# Patient Record
Sex: Male | Born: 1965 | Race: White | Hispanic: No | Marital: Married | Smoking: Never smoker
Health system: Southern US, Community
[De-identification: ages and names within clinical notes are randomized; demographics above are authoritative.]

## PROBLEM LIST (undated history)

## (undated) DIAGNOSIS — I493 Ventricular premature depolarization: Secondary | ICD-10-CM

## (undated) DIAGNOSIS — M24112 Other articular cartilage disorders, left shoulder: Secondary | ICD-10-CM

## (undated) DIAGNOSIS — R0683 Snoring: Secondary | ICD-10-CM

## (undated) HISTORY — PX: REPLACEMENT TOTAL KNEE: SUR1224

## (undated) HISTORY — PX: NOSE SURGERY: SHX723

---

## 2004-09-12 ENCOUNTER — Ambulatory Visit: Payer: Self-pay | Admitting: Internal Medicine

## 2006-01-03 ENCOUNTER — Ambulatory Visit: Payer: Self-pay | Admitting: Emergency Medicine

## 2006-01-03 ENCOUNTER — Inpatient Hospital Stay (HOSPITAL_COMMUNITY): Admission: AC | Admit: 2006-01-03 | Discharge: 2006-01-14 | Payer: Self-pay

## 2006-01-13 ENCOUNTER — Ambulatory Visit: Payer: Self-pay | Admitting: Physical Medicine & Rehabilitation

## 2006-01-14 ENCOUNTER — Ambulatory Visit: Payer: Self-pay | Admitting: Physical Medicine & Rehabilitation

## 2006-01-14 ENCOUNTER — Inpatient Hospital Stay (HOSPITAL_COMMUNITY)
Admission: RE | Admit: 2006-01-14 | Discharge: 2006-01-19 | Payer: Self-pay | Admitting: Physical Medicine & Rehabilitation

## 2006-01-26 ENCOUNTER — Encounter
Admission: RE | Admit: 2006-01-26 | Discharge: 2006-02-17 | Payer: Self-pay | Admitting: Physical Medicine & Rehabilitation

## 2007-08-30 IMAGING — CR DG CHEST 2V
2 series · 2 of 2 positions shown · non-contrast
Comparison: None.

CLINICAL DATA: 40-year-old, fell.  Right shoulder pain and chest pain. 
 RIGHT SHOULDER - 2 VIEW:

[w chest pa]
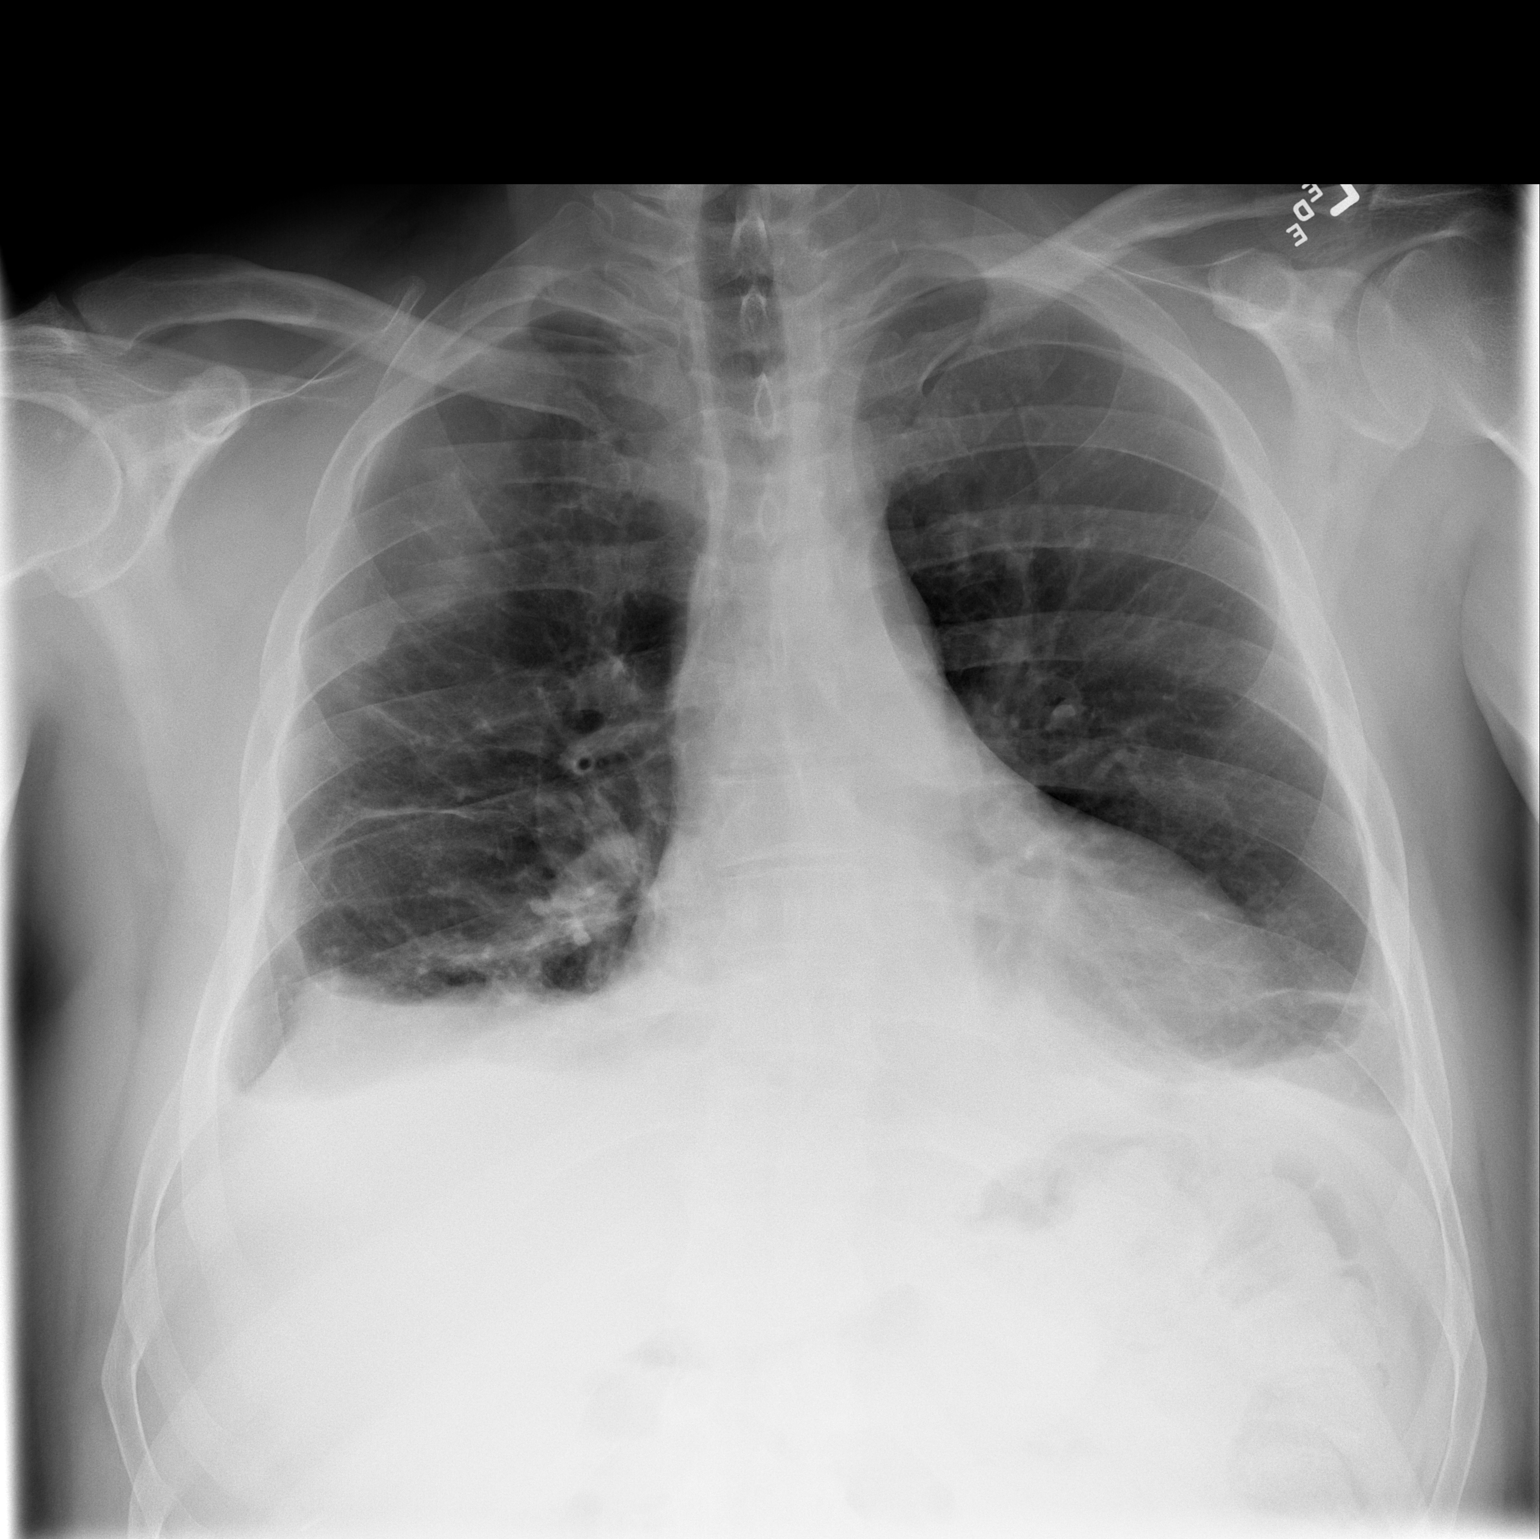

[w chest lat]
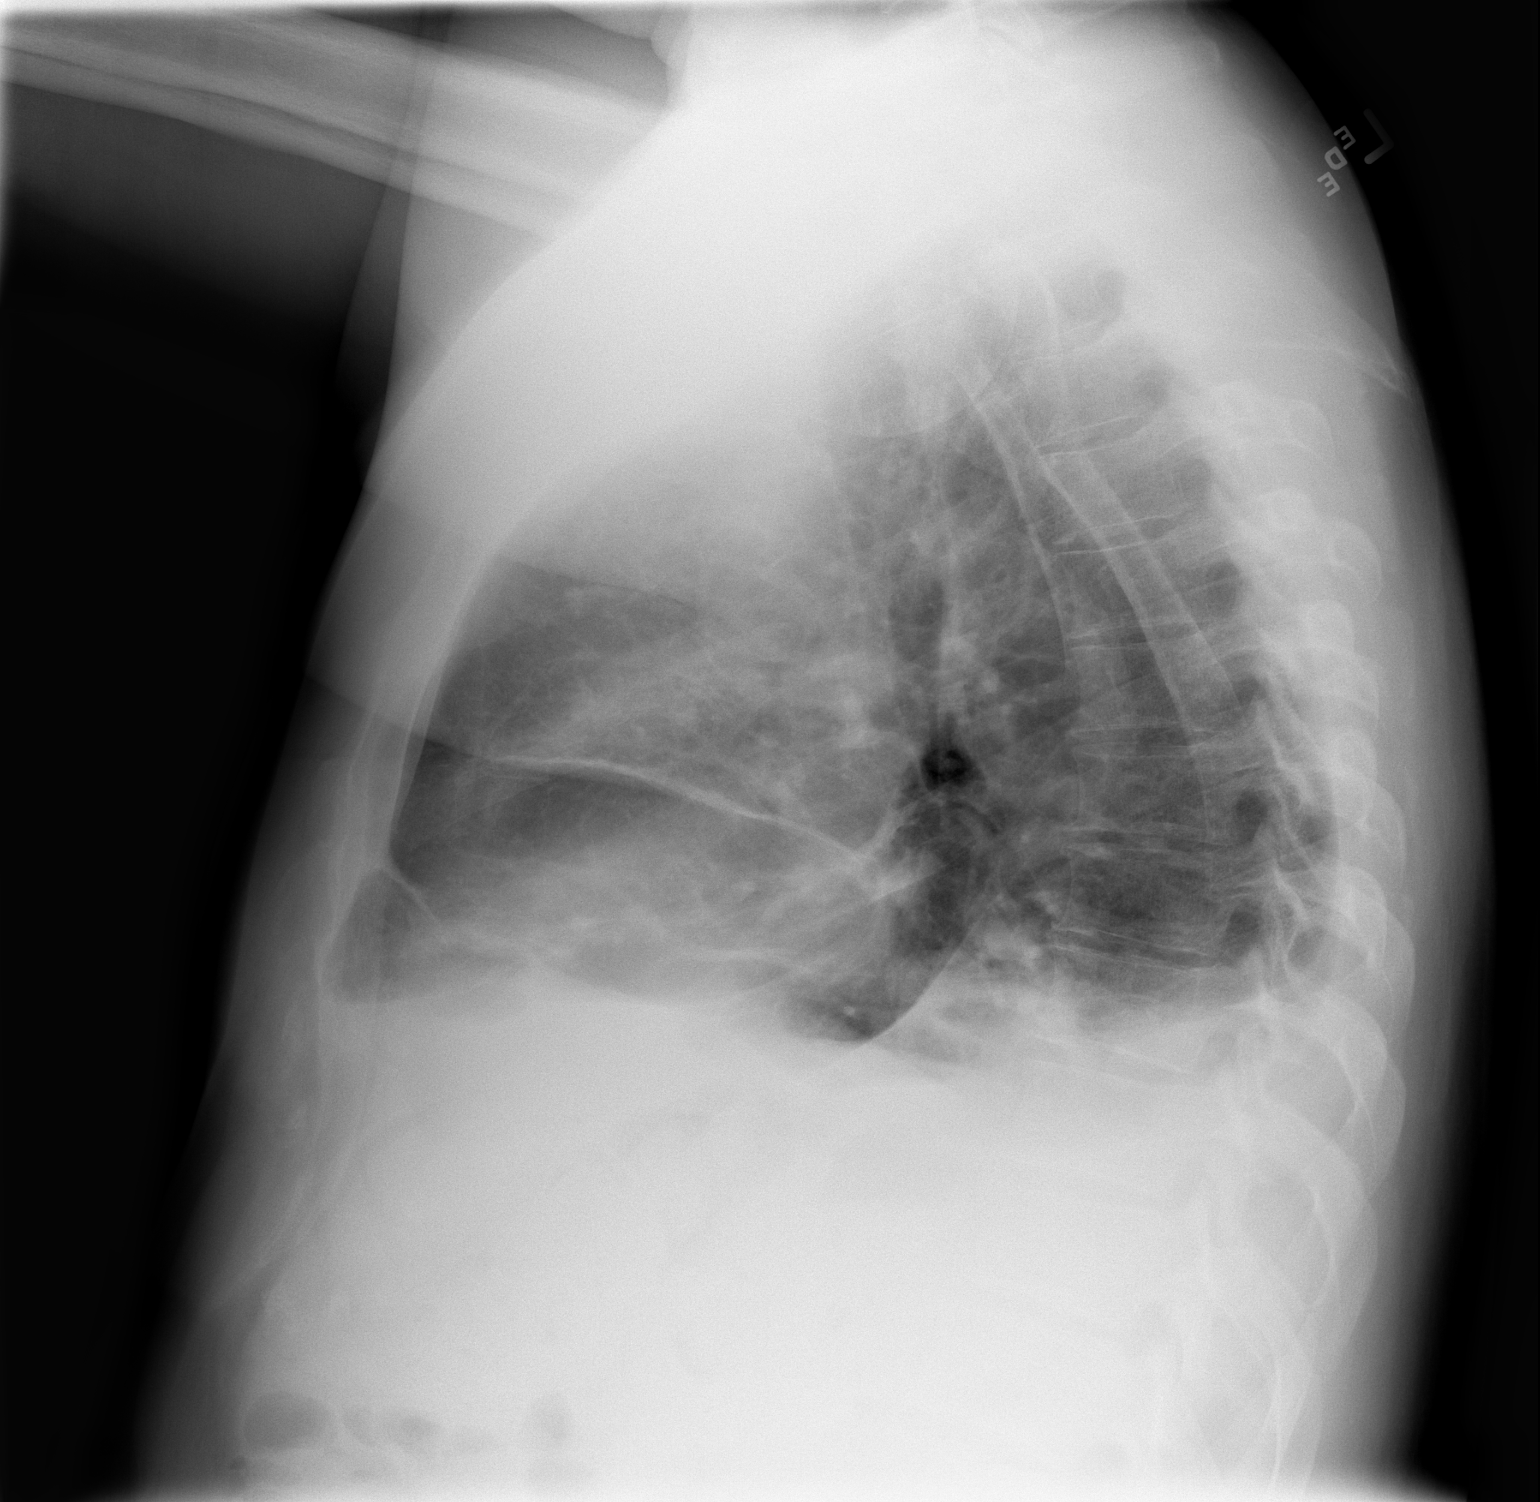

[2 of 2 positions shown; findings below may reference images not displayed]

FINDINGS: Mild AC joint degenerative changes.  There is mild lateral downsloping of the acromion.  No acute bony findings.  No abnormal soft tissue calcifications.  Patient does have right sided rib fractures.  The 2nd and 3rd anterior ribs are fractured and the 3rd, 4th and 5th posterior ribs are fractured.  No pneumothorax is seen.
IMPRESSION: No acute bony findings involving the shoulder . There are 2nd and 3rd anterior rib fractures and 3rd, 4th, and 5th posterior fractures.  
 CHEST - 2 VIEW:
FINDINGS: As seen on the shoulder films, there are right sided rib fractures.  The 2nd and 3rd anterior ribs are fractured and the 2nd, 3rd, and 4th posterior ribs are fractured.  No definite pneumothorax.  There is bibasilar atelectasis right greater than left and small bilateral pleural effusions right greater than left.  Heart is borderline enlarged.
IMPRESSION: 1.  Right sided rib fractures as discussed above.  No definite pneumothorax is seen. 
 2.  Bibasilar atelectasis and bilateral pleural effusions, right greater than left.

## 2008-09-27 ENCOUNTER — Telehealth: Payer: Self-pay | Admitting: Internal Medicine

## 2008-09-28 ENCOUNTER — Ambulatory Visit: Payer: Self-pay | Admitting: Family Medicine

## 2008-09-28 DIAGNOSIS — M7120 Synovial cyst of popliteal space [Baker], unspecified knee: Secondary | ICD-10-CM

## 2009-01-01 ENCOUNTER — Ambulatory Visit: Payer: Self-pay | Admitting: Family Medicine

## 2009-03-09 ENCOUNTER — Ambulatory Visit: Payer: Self-pay | Admitting: Internal Medicine

## 2009-03-09 DIAGNOSIS — M1710 Unilateral primary osteoarthritis, unspecified knee: Secondary | ICD-10-CM

## 2009-03-13 ENCOUNTER — Inpatient Hospital Stay (HOSPITAL_COMMUNITY): Admission: RE | Admit: 2009-03-13 | Discharge: 2009-03-14 | Payer: Self-pay | Admitting: Orthopedic Surgery

## 2010-03-10 LAB — CONVERTED CEMR LAB
BUN: 12 mg/dL (ref 6–23)
Calcium: 9 mg/dL (ref 8.4–10.5)
Creatinine, Ser: 0.7 mg/dL (ref 0.4–1.5)
Eosinophils Relative: 0.7 % (ref 0.0–5.0)
GFR calc non Af Amer: 129.48 mL/min (ref >60)
Lymphocytes Relative: 10.1 % — ABNORMAL LOW (ref 12.0–46.0)
Monocytes Absolute: 0.5 10*3/uL (ref 0.1–1.0)
Monocytes Relative: 4.2 % (ref 3.0–12.0)
Neutrophils Relative %: 85 % — ABNORMAL HIGH (ref 43.0–77.0)
Platelets: 171 10*3/uL (ref 150.0–400.0)
WBC: 11.4 10*3/uL — ABNORMAL HIGH (ref 4.5–10.5)

## 2010-03-12 NOTE — Assessment & Plan Note (Signed)
Summary: MED CLEAR FOR KNEE INJURY // RS   Vital Signs:  Patient profile:   45 year old male Height:      71.5 inches Weight:      241 pounds BMI:     33.26 Pulse rate:   76 / minute Resp:     12 per minute BP sitting:   140 / 92  (left arm)  Vitals Entered By: Gladis Riffle, RN (March 09, 2009 8:56 AM)  Nutrition Counseling: Patient's BMI is greater than 25 and therefore counseled on weight management options.  Is Patient Diabetic? No Nutritional Status clearance for knee surgery   History of Present Illness: asked to see by dr. Charlann Boxer for surgical clearance ---revision L total knee replacement  reviewed dr. 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 Gladis Riffle, RN (March 09, 2009 8:58 AM)  Past History:  Past Surgical History: Last updated: 09/28/2008 LASIK signs knee replacement 1995  Family History: Last updated: 09/28/2008 Family History High cholesterol Family History Hypertension Family History Kidney disease Family History Lung cancer Family History Diabetes   Social History: Last updated: 09/28/2008 Married Never Smoked Alcohol use-yes Drug use-no Regular exercise-no Occupation:  Surveyor, minerals  Risk Factors: Exercise: no (10/06/2006)  Risk Factors: Smoking Status:  never (03/09/2009)   Impression & Recommendations:  Problem # 1:  OSTEOARTHRITIS, KNEE (ICD-715.96) scheduled for surgery  Problem # 2:  PREOPERATIVE EXAMINATION (ICD-V72.84)  EKG pt is scheduled for surgery--- his risk for surgery is low (average for age).   cc : dr Charlann Boxer  Orders: EKG w/ Interpretation ([id number])   EKG  Procedure date:  03/09/2009  Findings:      normal:  bradycardia with HR of 59

## 2010-04-29 LAB — DIFFERENTIAL
Lymphocytes Relative: 18 % (ref 12–46)
Lymphs Abs: 1 10*3/uL (ref 0.7–4.0)
Monocytes Relative: 5 % (ref 3–12)
Neutro Abs: 4 10*3/uL (ref 1.7–7.7)
Neutrophils Relative %: 72 % (ref 43–77)

## 2010-04-29 LAB — BASIC METABOLIC PANEL
Calcium: 9.1 mg/dL (ref 8.4–10.5)
Chloride: 104 mEq/L (ref 96–112)
Creatinine, Ser: 0.81 mg/dL (ref 0.4–1.5)
GFR calc Af Amer: >60 mL/min (ref >60)

## 2010-04-29 LAB — CBC
MCV: 89 fL (ref 78.0–100.0)
RBC: 4.63 MIL/uL (ref 4.22–5.81)
WBC: 5.6 10*3/uL (ref 4.0–10.5)

## 2010-04-29 LAB — URINALYSIS, ROUTINE W REFLEX MICROSCOPIC
Glucose, UA: NEGATIVE mg/dL
Protein, ur: NEGATIVE mg/dL

## 2010-04-29 LAB — APTT: aPTT: 28 seconds (ref 24–37)

## 2010-04-29 LAB — PROTIME-INR: INR: 1 (ref 0.00–1.49)

## 2010-05-02 LAB — CBC
MCHC: 33.8 g/dL (ref 30.0–36.0)
Platelets: 194 10*3/uL (ref 150–400)
RDW: 14.4 % (ref 11.5–15.5)

## 2010-05-02 LAB — BASIC METABOLIC PANEL
BUN: 10 mg/dL (ref 6–23)
Calcium: 8.3 mg/dL — ABNORMAL LOW (ref 8.4–10.5)
GFR calc non Af Amer: >60 mL/min (ref >60)
Glucose, Bld: 103 mg/dL — ABNORMAL HIGH (ref 70–99)

## 2010-05-02 LAB — TYPE AND SCREEN
ABO/RH(D): A POS
Antibody Screen: NEGATIVE
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
## 2014-08-17 ENCOUNTER — Emergency Department (HOSPITAL_BASED_OUTPATIENT_CLINIC_OR_DEPARTMENT_OTHER)
Admission: EM | Admit: 2014-08-17 | Discharge: 2014-08-17 | Disposition: A | Discharge status: Home / Self Care | Payer: BLUE CROSS/BLUE SHIELD | Attending: Emergency Medicine | Admitting: Emergency Medicine

## 2014-08-17 ENCOUNTER — Emergency Department (HOSPITAL_BASED_OUTPATIENT_CLINIC_OR_DEPARTMENT_OTHER): Payer: BLUE CROSS/BLUE SHIELD

## 2014-08-17 ENCOUNTER — Encounter (HOSPITAL_BASED_OUTPATIENT_CLINIC_OR_DEPARTMENT_OTHER): Payer: Self-pay

## 2014-08-17 DIAGNOSIS — S299XXA Unspecified injury of thorax, initial encounter: Secondary | ICD-10-CM | POA: Diagnosis present

## 2014-08-17 DIAGNOSIS — S2242XA Multiple fractures of ribs, left side, initial encounter for closed fracture: Secondary | ICD-10-CM | POA: Diagnosis not present

## 2014-08-17 DIAGNOSIS — S301XXA Contusion of abdominal wall, initial encounter: Secondary | ICD-10-CM

## 2014-08-17 DIAGNOSIS — Y998 Other external cause status: Secondary | ICD-10-CM | POA: Diagnosis not present

## 2014-08-17 DIAGNOSIS — Y9289 Other specified places as the place of occurrence of the external cause: Secondary | ICD-10-CM | POA: Insufficient documentation

## 2014-08-17 DIAGNOSIS — L237 Allergic contact dermatitis due to plants, except food: Secondary | ICD-10-CM | POA: Insufficient documentation

## 2014-08-17 DIAGNOSIS — Y9339 Activity, other involving climbing, rappelling and jumping off: Secondary | ICD-10-CM | POA: Diagnosis not present

## 2014-08-17 DIAGNOSIS — W11XXXA Fall on and from ladder, initial encounter: Secondary | ICD-10-CM | POA: Insufficient documentation

## 2014-08-17 DIAGNOSIS — W19XXXA Unspecified fall, initial encounter: Secondary | ICD-10-CM

## 2014-08-17 DIAGNOSIS — S2232XA Fracture of one rib, left side, initial encounter for closed fracture: Secondary | ICD-10-CM

## 2014-08-17 DIAGNOSIS — S37012A Minor contusion of left kidney, initial encounter: Secondary | ICD-10-CM | POA: Diagnosis not present

## 2014-08-17 LAB — BASIC METABOLIC PANEL
Anion gap: 8 (ref 5–15)
BUN: 12 mg/dL (ref 6–20)
CHLORIDE: 103 mmol/L (ref 101–111)
CO2: 28 mmol/L (ref 22–32)
Calcium: 9 mg/dL (ref 8.9–10.3)
Creatinine, Ser: 0.8 mg/dL (ref 0.61–1.24)
GFR calc Af Amer: >60 mL/min (ref >60)
GFR calc non Af Amer: >60 mL/min (ref >60)
Glucose, Bld: 131 mg/dL — ABNORMAL HIGH (ref 65–99)
Potassium: 4.1 mmol/L (ref 3.5–5.1)
Sodium: 139 mmol/L (ref 135–145)

## 2014-08-17 LAB — CBC
HCT: 43.5 % (ref 39.0–52.0)
Hemoglobin: 14.3 g/dL (ref 13.0–17.0)
MCH: 29.1 pg (ref 26.0–34.0)
MCHC: 32.9 g/dL (ref 30.0–36.0)
MCV: 88.6 fL (ref 78.0–100.0)
PLATELETS: 137 10*3/uL — AB (ref 150–400)
RBC: 4.91 MIL/uL (ref 4.22–5.81)
RDW: 13.1 % (ref 11.5–15.5)
WBC: 11.4 10*3/uL — AB (ref 4.0–10.5)

## 2014-08-17 MED ORDER — KETOROLAC TROMETHAMINE 60 MG/2ML IM SOLN
60.0000 mg | Freq: Once | INTRAMUSCULAR | Status: AC
  Administered 2014-08-17: 60 mg via INTRAMUSCULAR
  Filled 2014-08-17: qty 2

## 2014-08-17 MED ORDER — DEXAMETHASONE SODIUM PHOSPHATE 10 MG/ML IJ SOLN
10.0000 mg | Freq: Once | INTRAMUSCULAR | Status: AC
  Administered 2014-08-17: 10 mg via INTRAMUSCULAR
  Filled 2014-08-17: qty 1

## 2014-08-17 MED ORDER — IOHEXOL 300 MG/ML  SOLN
100.0000 mL | Freq: Once | INTRAMUSCULAR | Status: AC | PRN
  Administered 2014-08-17: 100 mL via INTRAVENOUS

## 2014-08-17 MED ORDER — PREDNISONE 20 MG PO TABS: ORAL_TABLET | ORAL | Status: DC

## 2014-08-17 NOTE — ED Provider Notes (Signed)
Medical screening examination/treatment/procedure(s) were performed by non-physician practitioner and as supervising physician I was immediately available for consultation/collaboration.   EKG Interpretation   Date/Time:  Thursday August 17 2014 13:15:42 EDT Ventricular Rate:  59 PR Interval:  174 QRS Duration: 84 QT Interval:  402 QTC Calculation: 397 R Axis:   55 Text Interpretation:  Sinus bradycardia Otherwise normal ECG No previous  ECGs available Confirmed by Corleen Otwell  MD, Talisa Petrak [phone]) on 08/17/2014  1:18:27 PM      Results for orders placed or performed during the hospital encounter of 03/13/09  Urinalysis, Routine w reflex microscopic  Result Value Ref Range   Color, Urine YELLOW YELLOW   APPearance CLEAR CLEAR   Specific Gravity, Urine 1.025 1.005 - 1.030   pH 5.5 5.0 - 8.0   Glucose, UA NEGATIVE NEGATIVE mg/dL   Hgb urine dipstick NEGATIVE NEGATIVE   Bilirubin Urine NEGATIVE NEGATIVE   Ketones, ur NEGATIVE NEGATIVE mg/dL   Protein, ur NEGATIVE NEGATIVE mg/dL   Urobilinogen, UA 0.2 0.0 - 1.0 mg/dL   Nitrite NEGATIVE NEGATIVE   Leukocytes, UA  NEGATIVE    NEGATIVE MICROSCOPIC NOT DONE ON URINES WITH NEGATIVE PROTEIN, BLOOD, LEUKOCYTES, NITRITE, OR GLUCOSE <1000 mg/dL.  Basic metabolic panel  Result Value Ref Range   Sodium 138 135 - 145 mEq/L   Potassium 4.1 3.5 - 5.1 mEq/L   Chloride 104 96 - 112 mEq/L   CO2 27 19 - 32 mEq/L   Glucose, Bld 91 70 - 99 mg/dL   BUN 12 6 - 23 mg/dL   Creatinine, Ser 0.81 0.4 - 1.5 mg/dL   Calcium 9.1 8.4 - 10.5 mg/dL   GFR calc non Af Amer >60 >60 mL/min   GFR calc Af Amer  >60 mL/min    >60        The eGFR has been calculated using the MDRD equation. This calculation has not been validated in all clinical situations. eGFR's persistently <60 mL/min signify possible Chronic Kidney Disease.  CBC  Result Value Ref Range   WBC 5.6 4.0 - 10.5 K/uL   RBC 4.63 4.22 - 5.81 MIL/uL   Hemoglobin 13.7 13.0 - 17.0 g/dL   HCT 41.2  39.0 - 52.0 %   MCV 89.0 78.0 - 100.0 fL   MCHC 33.2 30.0 - 36.0 g/dL   RDW 14.4 11.5 - 15.5 %   Platelets 253 150 - 400 K/uL  Differential  Result Value Ref Range   Neutrophils Relative % 72 43 - 77 %   Neutro Abs 4.0 1.7 - 7.7 K/uL   Lymphocytes Relative 18 12 - 46 %   Lymphs Abs 1.0 0.7 - 4.0 K/uL   Monocytes Relative 5 3 - 12 %   Monocytes Absolute 0.3 0.1 - 1.0 K/uL   Eosinophils Relative 3 0 - 5 %   Eosinophils Absolute 0.2 0.0 - 0.7 K/uL   Basophils Relative 1 0 - 1 %   Basophils Absolute 0.1 0.0 - 0.1 K/uL  Protime-INR  Result Value Ref Range   Prothrombin Time 13.1 11.6 - 15.2 seconds   INR 1.00 0.00 - 1.49  APTT  Result Value Ref Range   aPTT 28 24 - 37 seconds  Basic metabolic panel  Result Value Ref Range   Sodium 138 135 - 145 mEq/L   Potassium 3.7 3.5 - 5.1 mEq/L   Chloride 104 96 - 112 mEq/L   CO2 28 19 - 32 mEq/L   Glucose, Bld 103 (H) 70 - 99 mg/dL  BUN 10 6 - 23 mg/dL   Creatinine, Ser 0.79 0.4 - 1.5 mg/dL   Calcium 8.3 (L) 8.4 - 10.5 mg/dL   GFR calc non Af Amer >60 >60 mL/min   GFR calc Af Amer  >60 mL/min    >60        The eGFR has been calculated using the MDRD equation. This calculation has not been validated in all clinical situations. eGFR's persistently <60 mL/min signify possible Chronic Kidney Disease.  CBC  Result Value Ref Range   WBC 7.8 4.0 - 10.5 K/uL   RBC 3.78 (L) 4.22 - 5.81 MIL/uL   Hemoglobin (L) 13.0 - 17.0 g/dL    11.4 DELTA CHECK NOTED RESULT REPEATED AND VERIFIED   HCT 33.8 (L) 39.0 - 52.0 %   MCV 89.3 78.0 - 100.0 fL   MCHC 33.8 30.0 - 36.0 g/dL   RDW 14.4 11.5 - 15.5 %   Platelets 194 DELTA CHECK NOTED SPECIMEN CHECKED FOR CLOTS 150 - 400 K/uL  Type and screen  Result Value Ref Range   ABO/RH(D) A POS    Antibody Screen NEG    Sample Expiration 03/16/2009   ABO/Rh  Result Value Ref Range   ABO/RH(D) A POS    Dg Ribs Unilateral W/chest Left  08/17/2014   CLINICAL DATA:  Left anterior rib pain after blunt  trauma this morning. The patient fell on a bucket.  EXAM: LEFT RIBS AND CHEST - 3+ VIEW  COMPARISON:  01/16/2006  FINDINGS: There are nondisplaced fractures of the anterior aspects of the left fifth and sixth ribs. No pneumothorax. No underlying pleural effusion lung contusion. Heart size and vascularity are normal. Lungs are clear. Multiple old healed right posterior rib fractures.  IMPRESSION: Acute fractures of the anterior aspects of the left fifth and sixth ribs.   Electronically Signed   By: Lorriane Shire M.D.   On: 08/17/2014 13:42    Patient seen by me. Patient missed this the last step of a ladder and fell onto a bucket. Bucket went into the left side of his chest he feels like it actually went up under the ribs. Patient has some mild tenderness in the left upper quadrant most of the tenderness and has some redness on the chest wall more around T6 T7 T8 area. X-ray shows evidence of a fracture of the fifth and sixth ribs anteriorly on the left side. Patient is fairly stoic. And once again back to work. The some mild concern for possible splenic injury not hypotensive no significant tenderness but based on the patient's personality feels best to do a CT of the abdomen to rule out any splenic injury. This would be a trauma type CT.  Fredia Sorrow, MD 08/17/14 1356

## 2014-08-17 NOTE — ED Notes (Addendum)
Pt missed last step of ladder-fell onto bucket-denies head/neck injury/pain-left side-no visible injury noted

## 2014-08-17 NOTE — ED Provider Notes (Signed)
CSN: [id number]     Arrival date & time 08/17/14  1247 History   First MD Initiated Contact with Patient 08/17/14 1310     Chief Complaint  Patient presents with  . Fall     (Consider location/radiation/quality/duration/timing/severity/associated sxs/prior Treatment) HPI Comments: Patient presents with complaint of left sided rib pain that started acutely today after he jumped approximately 3 feet off a ladder landing on his feet and then fell onto his left side of landing on a 5 gallon plastic bucket that impacted on his left lower ribs. Patient had immediate severe pain. Pain radiated up into his chest. No shortness of breath. No hemoptysis. No head or neck injury. No significant abdominal pain. Pain is worse with deep breathing. No treatments prior to arrival. No weakness in arms or legs. Patient is ambulatory. The onset of this condition was acute. The course is constant. Alleviating factors: none.    The history is provided by the patient.    History reviewed. No pertinent past medical history. Past Surgical History  Procedure Laterality Date  . Nose surgery    . Replacement total knee     No family history on file. History  Substance Use Topics  . Smoking status: Never Smoker   . Smokeless tobacco: Not on file  . Alcohol Use: No    Review of Systems  Constitutional: Negative for fever.  HENT: Negative for rhinorrhea and sore throat.   Eyes: Negative for redness.  Respiratory: Negative for cough, chest tightness and shortness of breath.   Cardiovascular: Positive for chest pain.  Gastrointestinal: Negative for nausea, vomiting, abdominal pain and diarrhea.  Genitourinary: Negative for dysuria.  Musculoskeletal: Positive for myalgias.  Skin: Negative for rash.  Neurological: Negative for headaches.    Allergies  Review of patient's allergies indicates no known allergies.  Home Medications   Prior to Admission medications   Not on File   BP 126/77 mmHg  Pulse 58   Temp(Src) 97.5 F (36.4 C) (Oral)  Resp 16  Ht 6' (1.829 m)  Wt 225 lb (102.059 kg)  BMI 30.51 kg/m2  SpO2 100%   Physical Exam  Constitutional: He appears well-developed and well-nourished.  HENT:  Head: Normocephalic and atraumatic.  Eyes: Conjunctivae are normal. Right eye exhibits no discharge. Left eye exhibits no discharge.  Neck: Normal range of motion. Neck supple.  Cardiovascular: Normal rate, regular rhythm and normal heart sounds.   No murmur heard. Pulmonary/Chest: Effort normal and breath sounds normal. No respiratory distress. He has no wheezes. He has no rales. He exhibits tenderness.    Abdominal: Soft. There is no tenderness. There is no rebound and no guarding.  Neurological: He is alert.  Skin: Skin is warm and dry.  Psychiatric: He has a normal mood and affect.  Nursing note and vitals reviewed.   ED Course  Procedures (including critical care time) Labs Review Labs Reviewed  CBC - Abnormal; Notable for the following:    WBC 11.4 (*)    Platelets 137 (*)    All other components within normal limits  BASIC METABOLIC PANEL - Abnormal; Notable for the following:    Glucose, Bld 131 (*)    All other components within normal limits    Imaging Review Dg Ribs Unilateral W/chest Left  08/17/2014   CLINICAL DATA:  Left anterior rib pain after blunt trauma this morning. The patient fell on a bucket.  EXAM: LEFT RIBS AND CHEST - 3+ VIEW  COMPARISON:  01/16/2006  FINDINGS: There  are nondisplaced fractures of the anterior aspects of the left fifth and sixth ribs. No pneumothorax. No underlying pleural effusion lung contusion. Heart size and vascularity are normal. Lungs are clear. Multiple old healed right posterior rib fractures.  IMPRESSION: Acute fractures of the anterior aspects of the left fifth and sixth ribs.   Electronically Signed   By: Francene Boyers M.D.   On: 08/17/2014 13:42   Ct Abdomen Pelvis W Contrast  08/17/2014   CLINICAL DATA:  Fall.  Trauma.   EXAM: CT ABDOMEN AND PELVIS WITH CONTRAST  TECHNIQUE: Multidetector CT imaging of the abdomen and pelvis was performed using the standard protocol following bolus administration of intravenous contrast.  CONTRAST:  OMNIPAQUE IOHEXOL 300 MG/ML  SOLN  COMPARISON:  None.  FINDINGS: Lower chest: Lung bases are clear. Chronic right lower rib fracture posteriorly. Heart size normal  Hepatobiliary: Contracted gallbladder.  Normal liver and bile ducts.  Pancreas: Negative  Spleen: Negative  Adrenals/Urinary Tract: Mild stranding around the left lower pole. This is most likely due to mild renal injury and contusion. No renal laceration. Left flank hematoma noted. Nondisplaced fracture left posterior eleventh rib. Right adrenal microcalcifications without mass lesion.  Stomach/Bowel: Negative for bowel obstruction. No bowel wall edema or thickening. Normal appendix.  Vascular/Lymphatic: Negative  Reproductive: Mild prostate enlargement.  Other: Edema in the left flank subcutaneous tissues consistent with hematoma from injury.  Musculoskeletal: Nondisplaced fracture left posterior eleventh rib.  IMPRESSION: Left flank soft tissue hematoma. Mild perinephric edema on the left likely due to mild hemorrhage. No renal laceration. Nondisplaced fracture left posterior eleventh rib.   Electronically Signed   By: Marlan Palau M.D.   On: 08/17/2014 15:04     EKG Interpretation   Date/Time:  Thursday August 17 2014 13:15:42 EDT Ventricular Rate:  59 PR Interval:  174 QRS Duration: 84 QT Interval:  402 QTC Calculation: 397 R Axis:   55 Text Interpretation:  Sinus bradycardia Otherwise normal ECG No previous  ECGs available Confirmed by ZACKOWSKI  MD, SCOTT ([id number]) on 08/17/2014  1:18:27 PM       1:40 PM Patient seen and examined. Work-up initiated. Medications ordered. D/w Dr. Deretha Emory.   Vital signs reviewed and are as follows: BP 126/77 mmHg  Pulse 58  Temp(Src) 97.5 F (36.4 C) (Oral)  Resp 16  Ht 6'  (1.829 m)  Wt 225 lb (102.059 kg)  BMI 30.51 kg/m2  SpO2 100%   1:55 PM Dr. Deretha Emory has seen. Will image abdomen as a precautionary measure.   4:23 PM patient and family updated on CT findings. Patient also asks for medication for poison ivy. He has a diffuse dermatitis consistent with poison ivy dermatitis. Patient given IM Decadron and a course of prednisone for home. Encouraged PCP follow-up in the next 3 days for recheck of his creatinine and urine. Patient encouraged to return with worsening pain or worsening symptoms, shortness of breath, trouble breathing or other concerns. Family and patient verbalized understanding and agrees with plan. He wishes to use Tylenol and ibuprofen at home for pain. He does not want prescribed pain medications.    MDM   Final diagnoses:  Fall  Broken ribs, left, closed, initial encounter  Traumatic hematoma of flank, initial encounter  Renal contusion, left, initial encounter  Poison ivy dermatitis   Patient after fall. Flank hematoma, 3 broken ribs, possible mild renal contusion. Normal spleen. Labs are reassuring. Normal creatinine and hemoglobin. Vital signs are normal. Pain control is the patient's main  problem at this point. Controlled well with IM Toradol in emergency department. Will continue NSAIDs at home. Patient does not want narcotic pain medications.  Poison ivy: Treated with steroids    Renne Crigler, PA-C 08/17/14 1625

## 2014-08-17 NOTE — Discharge Instructions (Signed)
Please read and follow all provided instructions.  Your diagnoses today include:  1. Broken ribs, left, closed, initial encounter   2. Fall   3. Traumatic hematoma of flank, initial encounter   4. Renal contusion, left, initial encounter   5. Poison ivy dermatitis     Tests performed today include:  Blood counts, kidney function-normal  X-ray of chest with normal lungs, broken 5th and 6th anterior ribs  Abdominal CT -- left flank hematoma, possible mild kidney contusion, broken posterior left rib  Vital signs. See below for your results today.   Medications prescribed:   Prednisone - steroid medicine   It is best to take this medication in the morning to prevent sleeping problems. If you are diabetic, monitor your blood sugar closely and stop taking Prednisone if blood sugar is over 300. Take with food to prevent stomach upset.   Take any prescribed medications only as directed.  Home care instructions:  Follow any educational materials contained in this packet.  BE VERY CAREFUL not to take multiple medicines containing Tylenol (also called acetaminophen). Doing so can lead to an overdose which can damage your liver and cause liver failure and possibly death.   Follow-up instructions: Please follow-up with your primary care provider in the next 3 days for further evaluation of your symptoms and recheck of kidney.   Return instructions:   Please return to the Emergency Department if you experience worsening symptoms.   Please return if you have any other emergent concerns.  Additional Information:  Your vital signs today were: BP 150/74 mmHg   Pulse 65   Temp(Src) 97.9 F (36.6 C) (Oral)   Resp 18   Ht 6' (1.829 m)   Wt 225 lb (102.059 kg)   BMI 30.51 kg/m2   SpO2 99% If your blood pressure (BP) was elevated above 135/85 this visit, please have this repeated by your doctor within one month. --------------

## 2014-08-17 NOTE — ED Notes (Signed)
PA Geiple at bedside.  

## 2016-03-30 IMAGING — DX DG RIBS W/ CHEST 3+V*L*
3 series · 3 of 3 positions shown · non-contrast
Comparison: 01/16/2006

CLINICAL DATA: Left anterior rib pain after blunt trauma this
morning. The patient fell on a bucket.

EXAM:
LEFT RIBS AND CHEST - 3+ VIEW

[chest pa]
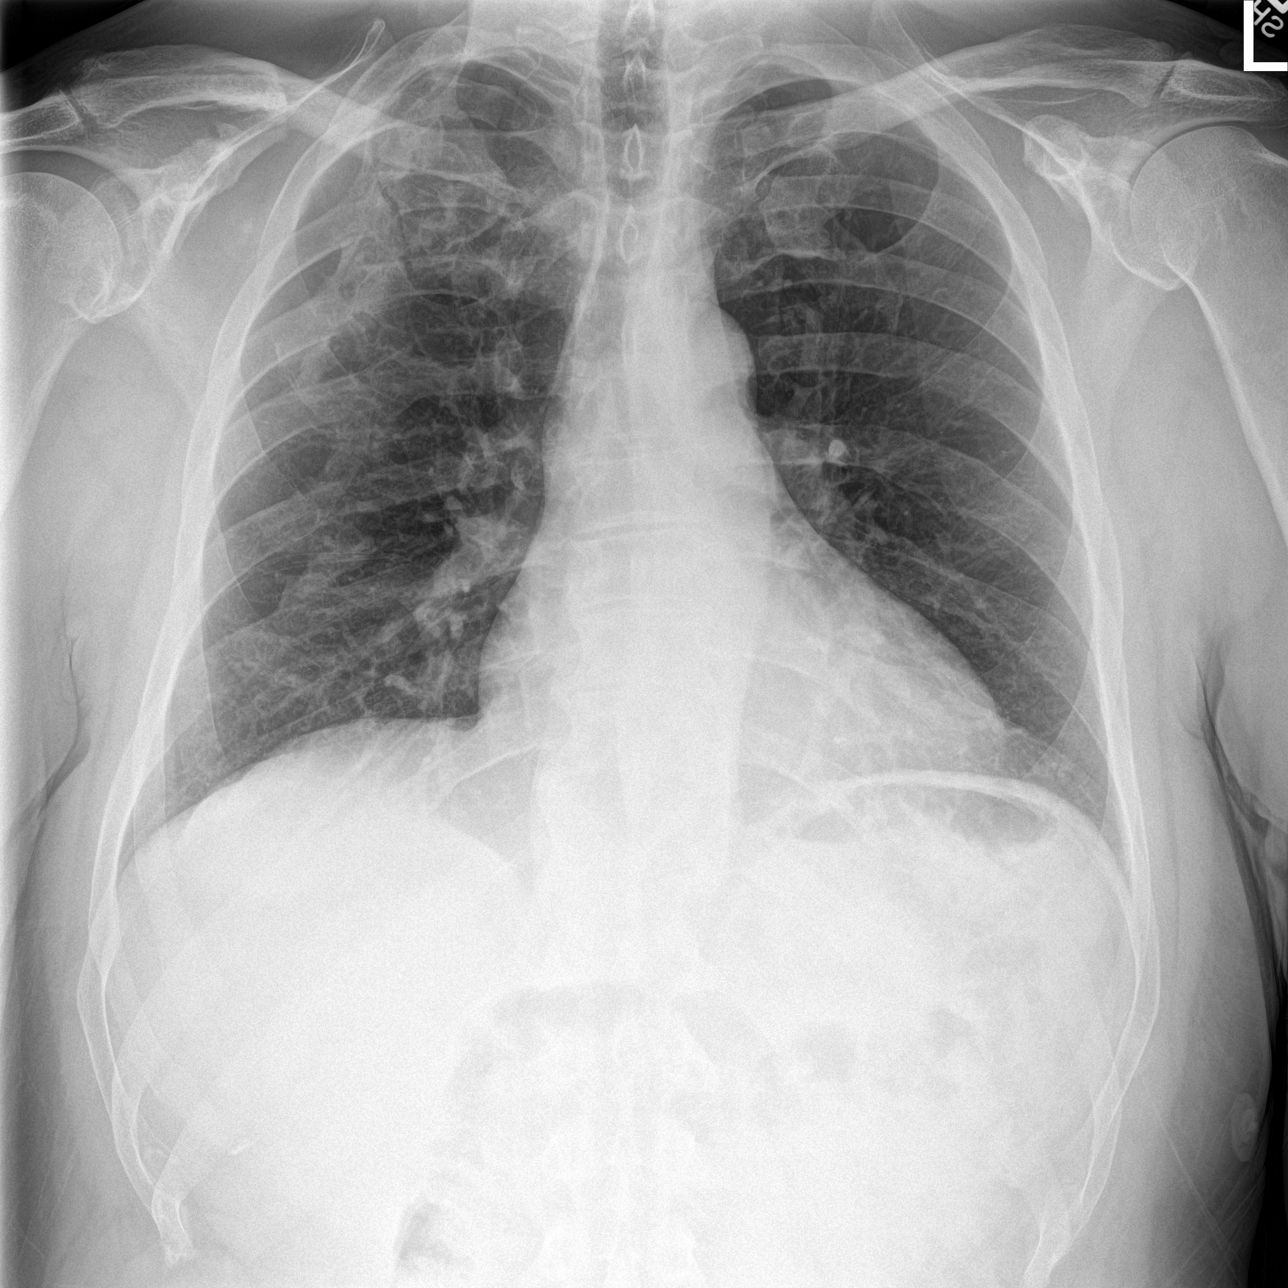

[rib pa obl]
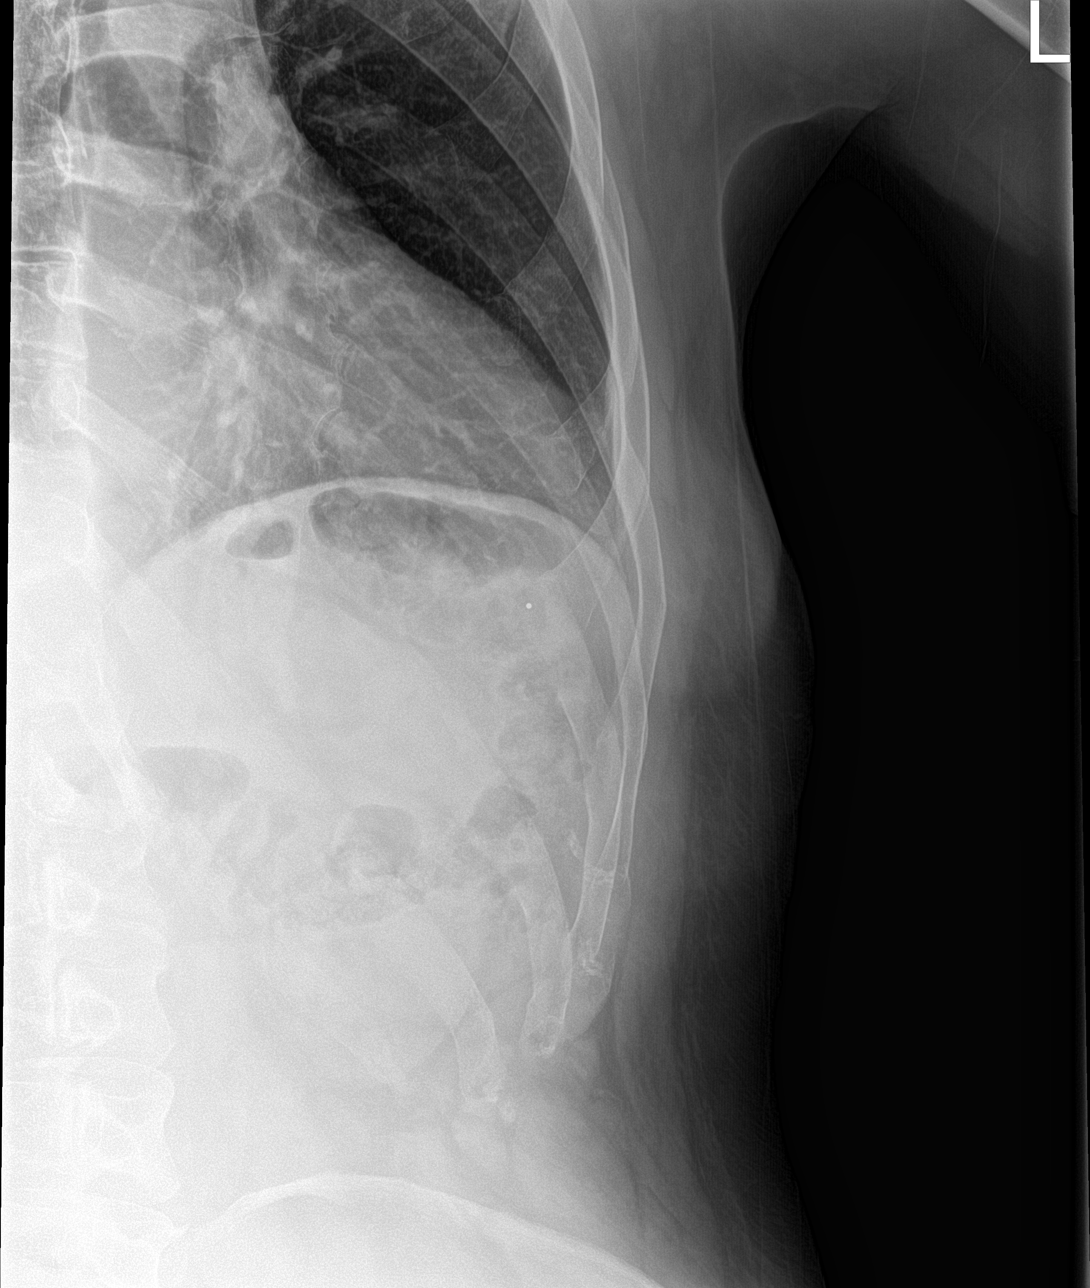

[rib pa]
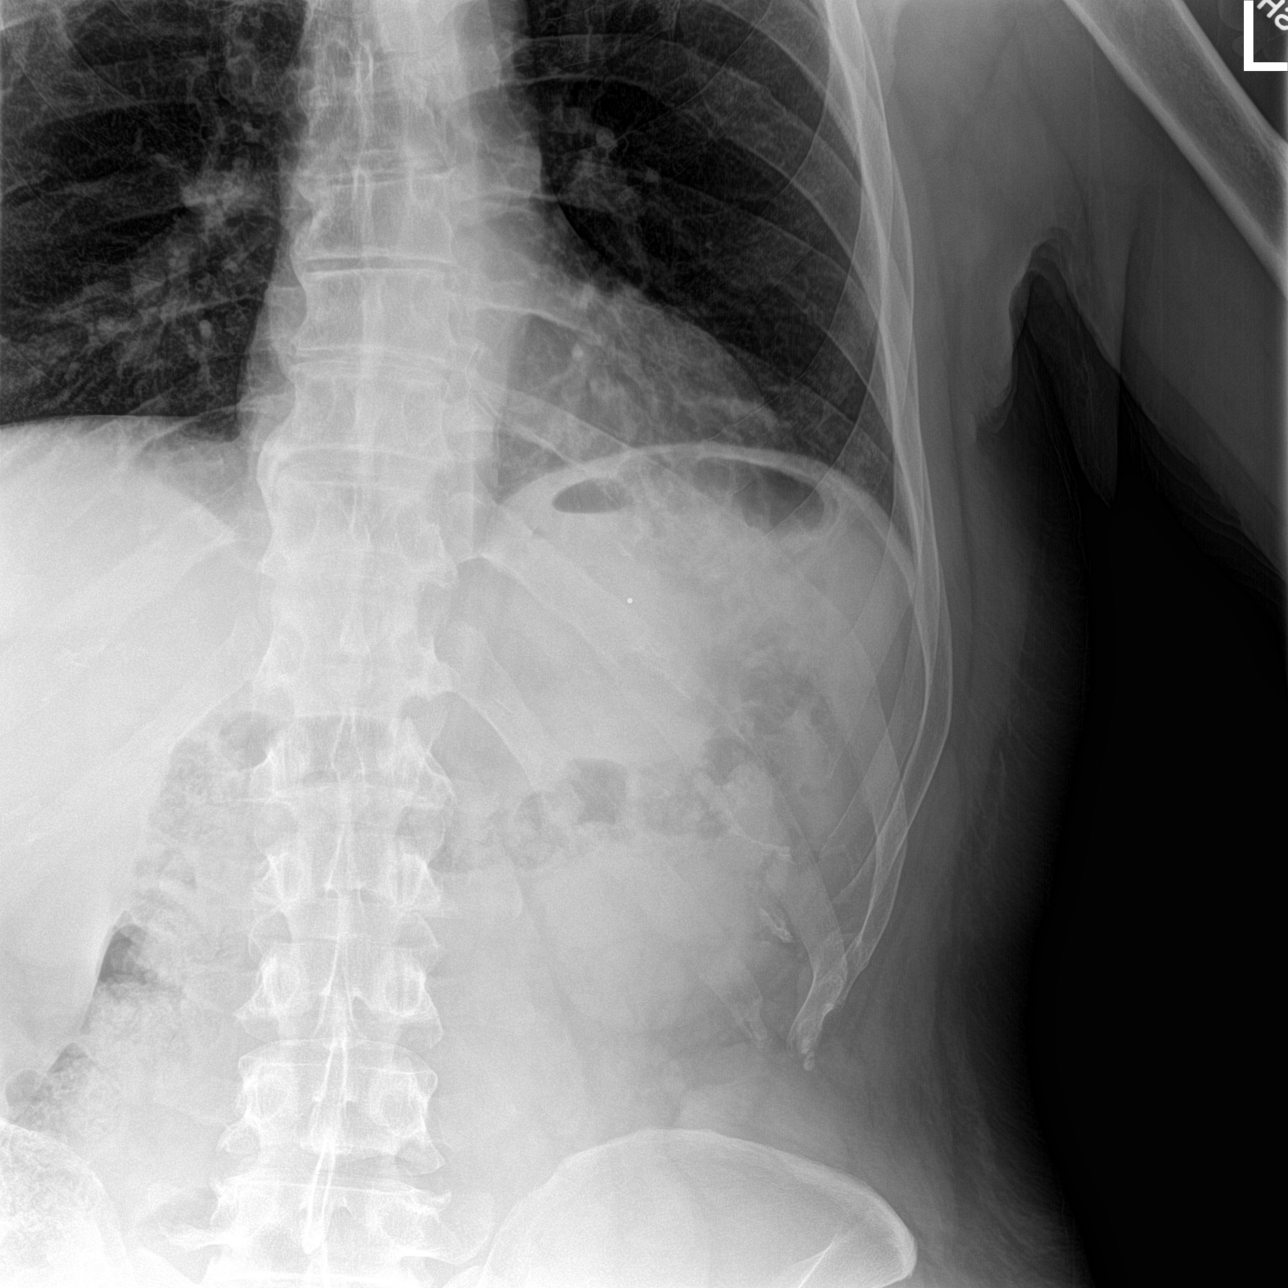

[3 of 3 positions shown; findings below may reference images not displayed]

FINDINGS: There are nondisplaced fractures of the anterior aspects of the left
fifth and sixth ribs. No pneumothorax. No underlying pleural
effusion lung contusion. Heart size and vascularity are normal.
Lungs are clear. Multiple old healed right posterior rib fractures.
IMPRESSION: Acute fractures of the anterior aspects of the left fifth and sixth
ribs.

## 2016-06-16 ENCOUNTER — Ambulatory Visit: Payer: BLUE CROSS/BLUE SHIELD | Admitting: Family Medicine

## 2016-12-08 ENCOUNTER — Ambulatory Visit: Payer: BLUE CROSS/BLUE SHIELD | Admitting: Physician Assistant

## 2016-12-08 DIAGNOSIS — Z0289 Encounter for other administrative examinations: Secondary | ICD-10-CM

## 2016-12-08 NOTE — Progress Notes (Deleted)
    Patient presents to clinic today for annual exam.  Patient is fasting for labs.  Acute Concerns: ***  Chronic Issues: ***  Health Maintenance: Immunizations -- *** Colonoscopy -- ***  No past medical history on file.  Past Surgical History:  Procedure Laterality Date  . NOSE SURGERY    . REPLACEMENT TOTAL KNEE      Current Outpatient Prescriptions on File Prior to Visit  Medication Sig Dispense Refill  . predniSONE (DELTASONE) 20 MG tablet 3 Tabs PO Days 1-3, then 2 tabs PO Days 4-6, then 1 tab PO Day 7-9, then Half Tab PO Day 10-12 20 tablet 0   No current facility-administered medications on file prior to visit.     No Known Allergies  No family history on file.  Social History   Social History  . Marital status: Married    Spouse name: N/A  . Number of children: N/A  . Years of education: N/A   Occupational History  . Not on file.   Social History Main Topics  . Smoking status: Never Smoker  . Smokeless tobacco: Not on file  . Alcohol use No  . Drug use: No  . Sexual activity: Not on file   Other Topics Concern  . Not on file   Social History Narrative  . No narrative on file   ROS  There were no vitals taken for this visit.  Physical Exam   Assessment/Plan: No problem-specific Assessment & Plan notes found for this encounter.    Piedad ClimesMartin, Camyra Vaeth Cody, PA-C

## 2016-12-19 ENCOUNTER — Ambulatory Visit: Payer: BLUE CROSS/BLUE SHIELD | Admitting: Family Medicine

## 2018-07-01 NOTE — Progress Notes (Signed)
Primary Physician:  Gaspar Garbeisovec, Richard W, MD   Patient ID: Chad Graves, male    DOB: September 15, 1965, 53 y.o.   MRN: [id number]  Subjective:    Chief Complaint  Patient presents with  . New Patient (Initial Visit)  . Bradycardia    HPI: Chad Graves  is a 53 y.o. male  with obesity, history of substance abuse, referred to us for abnormal EKG.  Patient has not been seen by physician in the last 20 years, but did recently have telehealth visit one month ago and states that his labs were all normal. He denies any history of hypertension, diabetes, or hyperlipidemia. He has not been tested for sleep apnea. Wife does report snoring. States that he gives plasma regularly and has never been rejected for this.  He recently fell while on the job and dislocated his left shoulder. He was scheduled for surgery last week; however, in pre-op EKG revealed frequent PVC's. Surgery was cancelled until he could have further workup.  He reports occasional chest tightness that does not stop him from activity and last for a few seconds. No associated shortness of breath or pain radiation. He denies any dyspnea on exertion, palpitations, dizziness, syncope, leg swelling, no symptoms suggestive of claudication or TIA.  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 Does have periods where he does not get enough sleep and works for 20+ hours straight.  Patient is fairly active with his job that includes lots of walking. He is hoping to start losing weight with diet and exercise. Denies any former or current tobacco or alcohol use. He does use small amount of marijuana every other day. Previously used coccaine for about 4 years in his 5120's. He has been abstinent since that time.  Patient reports that his son as SVT and his age 55. Father had aneurysm rupture in his 570's he also had CKD. Paternal grandfather died from MI in his 5570's as well. Marland Kitchen.   History reviewed. No pertinent past medical history.  Past  Surgical History:  Procedure Laterality Date  . NOSE SURGERY    . REPLACEMENT TOTAL KNEE      Social History   Socioeconomic History  . Marital status: Married    Spouse name: Not on file  . Number of children: 1  . Years of education: Not on file  . Highest education level: Not on file  Occupational History  . Not on file  Social Needs  . Financial resource strain: Not on file  . Food insecurity:    Worry: Not on file    Inability: Not on file  . Transportation needs:    Medical: Not on file    Non-medical: Not on file  Tobacco Use  . Smoking status: Never Smoker  . Smokeless tobacco: Never Used  Substance and Sexual Activity  . Alcohol use: No  . Drug use: No  . Sexual activity: Not on file  Lifestyle  . Physical activity:    Days per week: Not on file    Minutes per session: Not on file  . Stress: Not on file  Relationships  . Social connections:    Talks on phone: Not on file    Gets together: Not on file    Attends religious service: Not on file    Active member of club or organization: Not on file    Attends meetings of clubs or organizations: Not on file    Relationship status: Not on file  .  Intimate partner violence:    Fear of current or ex partner: Not on file    Emotionally abused: Not on file    Physically abused: Not on file    Forced sexual activity: Not on file  Other Topics Concern  . Not on file  Social History Narrative  . Not on file    Review of Systems  Constitution: Positive for weight gain. Negative for decreased appetite, malaise/fatigue and weight loss.  Eyes: Negative for visual disturbance.  Cardiovascular: Negative for chest pain, claudication, dyspnea on exertion, leg swelling, orthopnea, palpitations and syncope.  Respiratory: Positive for snoring. Negative for hemoptysis and wheezing.   Endocrine: Negative for cold intolerance and heat intolerance.  Hematologic/Lymphatic: Does not bruise/bleed easily.  Skin: Negative for  nail changes.  Musculoskeletal: Positive for joint pain. Negative for muscle weakness and myalgias.  Gastrointestinal: Negative for abdominal pain, change in bowel habit, nausea and vomiting.  Neurological: Negative for difficulty with concentration, dizziness, focal weakness and headaches.  Psychiatric/Behavioral: Negative for altered mental status and suicidal ideas.  All other systems reviewed and are negative.     Objective:  Blood pressure 133/79, pulse (!) 41, height 6' (1.829 m), weight 270 lb 3.2 oz (122.6 kg), SpO2 97 %. Body mass index is 36.65 kg/m.    Physical Exam  Constitutional: He is oriented to person, place, and time. Vital signs are normal. He appears well-developed and well-nourished.  HENT:  Head: Normocephalic and atraumatic.  Neck: Normal range of motion.  Cardiovascular: Normal rate, regular rhythm, normal heart sounds and intact distal pulses. Frequent extrasystoles are present.  Pulmonary/Chest: Effort normal and breath sounds normal. No accessory muscle usage. No respiratory distress.  Abdominal: Soft. Bowel sounds are normal.  Musculoskeletal: Normal range of motion.  Neurological: He is alert and oriented to person, place, and time.  Skin: Skin is warm and dry.  Vitals reviewed.  Radiology: No results found.  Laboratory examination:    CMP Latest Ref Rng & Units 08/17/2014 03/14/2009 03/12/2009  Glucose 65 - 99 mg/dL 161(W) 960(A) 91  BUN 6 - 20 mg/dL Creatinine 0.61 - 1.24 mg/dL 5.40 9.81 1.91  Sodium 135 - 145 mmol/L 139 138 138  Potassium 3.5 - 5.1 mmol/L 4.1 3.7 4.1  Chloride 101 - 111 mmol/L 103 104 104  CO2 22 - 32 mmol/L Calcium 8.9 - 10.3 mg/dL 9.0 4.7(W) 9.1   CBC Latest Ref Rng & Units 08/17/2014 03/14/2009 03/12/2009  WBC 4.0 - 10.5 K/uL 11.4(H) 7.8 5.6  Hemoglobin 13.0 - 17.0 g/dL 29.5 62.1 DELTA CHECK NOTED RESULT REPEATED AND VERIFIED(L) 13.7  Hematocrit 39.0 - 52.0 % 43.5 33.8(L) 41.2  Platelets 150 - 400 K/uL 137(L)  194 DELTA CHECK NOTED SPECIMEN CHECKED FOR CLOTS 253   Lipid Panel  No results found for: CHOL, TRIG, HDL, CHOLHDL, VLDL, LDLCALC, LDLDIRECT HEMOGLOBIN A1C No results found for: HGBA1C, MPG TSH No results for input(s): TSH in the last 8760 hours.  PRN Meds:. Medications Discontinued During This Encounter  Medication Reason  . predniSONE (DELTASONE) 20 MG tablet Error   No outpatient medications have been marked as taking for the 07/02/18 encounter (Office Visit) with Toniann Fail, NP.    Cardiac Studies:     Assessment:   Frequent PVCs - Plan: EKG 12-Lead, PCV ECHOCARDIOGRAM COMPLETE, PCV MYOCARDIAL PERFUSION WITH LEXISCAN  Abnormal EKG  History of substance abuse (HCC)  Atypical chest pain  Moderate obesity  EKG 07/02/2018: Normal sinus  rhythm at 77 bpm, PVC's in bigeminal pattern, left atrial enlargement, no evidence of ischemia. Abnormal EKG  Recommendations:   Patient without any history of hypertension, hyperlipidemia, diabetes, recent left should dislocation here for preoperative cardiac risk stratification and abnormal EKG.   Patient has asymptomatic, newly noted frequent PVC's on EKG. He has not been evaluated by physician in several years. I will obtain recent labs from PCP office for further risk stratification. I have discussed the multiple etiology's for PVC's. He does have history of coccaine use and now occasional marijuana use. He will benefit from lexiscan nuclear stress test for further evaluation to exclude myocardial ischemia as possible etiology. Will also obtain echocardiogram to exclude any structural abnormalities. He does have occasional chest pain not necessarily associated with exertion that last a few seconds. Not suggestive of angina. No clinical evidence of heart failure.   He does have snoring and may benefit from evaluation for sleep apnea as this could also be contributing to PVC's as well as his obesity. Will defer to PCP if he should  have sleep study. I have encouraged him to work on weight loss with diet modifications. Once cardiac testing is complete, he can start exercise as tolerated. Will arrange for testing ASAP to not further delay surgery. Will see him afterwards to discuss results.   *I have discussed this case with Dr. Jacinto Halim and he personally examined the patient and participated in formulating the plan.*   Toniann Fail, MSN, APRN, FNP-C [facility] [place] Office: [phone] Fax: [phone]

## 2018-07-02 ENCOUNTER — Ambulatory Visit (INDEPENDENT_AMBULATORY_CARE_PROVIDER_SITE_OTHER): Payer: 59 | Admitting: Cardiology

## 2018-07-02 ENCOUNTER — Encounter: Payer: Self-pay | Admitting: Cardiology

## 2018-07-02 ENCOUNTER — Other Ambulatory Visit (HOSPITAL_COMMUNITY): Payer: Self-pay | Admitting: Cardiology

## 2018-07-02 ENCOUNTER — Other Ambulatory Visit: Payer: Self-pay | Admitting: Cardiology

## 2018-07-02 ENCOUNTER — Other Ambulatory Visit: Payer: Self-pay

## 2018-07-02 VITALS — BP 133/79 | HR 41 | Ht 72.0 in | Wt 270.2 lb

## 2018-07-02 DIAGNOSIS — Z01810 Encounter for preprocedural cardiovascular examination: Secondary | ICD-10-CM

## 2018-07-02 DIAGNOSIS — R9431 Abnormal electrocardiogram [ECG] [EKG]: Secondary | ICD-10-CM

## 2018-07-02 DIAGNOSIS — I493 Ventricular premature depolarization: Secondary | ICD-10-CM

## 2018-07-02 DIAGNOSIS — R0789 Other chest pain: Secondary | ICD-10-CM

## 2018-07-02 DIAGNOSIS — E668 Other obesity: Secondary | ICD-10-CM

## 2018-07-02 DIAGNOSIS — F1911 Other psychoactive substance abuse, in remission: Secondary | ICD-10-CM

## 2018-07-03 ENCOUNTER — Encounter: Payer: Self-pay | Admitting: Cardiology

## 2018-07-06 ENCOUNTER — Ambulatory Visit (HOSPITAL_COMMUNITY)
Admission: RE | Admit: 2018-07-06 | Discharge: 2018-07-06 | Disposition: A | Discharge status: Home / Self Care | Payer: 59 | Source: Ambulatory Visit | Attending: Internal Medicine | Admitting: Internal Medicine

## 2018-07-06 ENCOUNTER — Other Ambulatory Visit: Payer: Self-pay

## 2018-07-06 DIAGNOSIS — I493 Ventricular premature depolarization: Secondary | ICD-10-CM | POA: Diagnosis not present

## 2018-07-06 DIAGNOSIS — Z01810 Encounter for preprocedural cardiovascular examination: Secondary | ICD-10-CM | POA: Diagnosis not present

## 2018-07-06 NOTE — Progress Notes (Signed)
  Echocardiogram 2D Echocardiogram has been performed.  Chad Graves 07/06/2018, 8:52 AM

## 2018-07-07 ENCOUNTER — Telehealth: Payer: Self-pay

## 2018-07-07 NOTE — Telephone Encounter (Signed)
I spoke to the patient and his wife, they wanted to know if the follow up could be moved up sooner, because the patient is in a lot of pain and wants to get his surgery done ASAP

## 2018-07-07 NOTE — Telephone Encounter (Signed)
-----   Message from Toniann Fail, NP sent at 07/06/2018 12:45 PM EDT ----- Let patient know that I reviewed his echocardiogram report, normal heart function.  Will further discuss details after his stress test.

## 2018-07-09 ENCOUNTER — Other Ambulatory Visit: Payer: Self-pay

## 2018-07-09 ENCOUNTER — Ambulatory Visit (INDEPENDENT_AMBULATORY_CARE_PROVIDER_SITE_OTHER): Payer: 59

## 2018-07-09 DIAGNOSIS — I493 Ventricular premature depolarization: Secondary | ICD-10-CM

## 2018-07-12 ENCOUNTER — Ambulatory Visit: Payer: Self-pay | Admitting: Orthopaedic Surgery

## 2018-07-12 ENCOUNTER — Encounter: Payer: Self-pay | Admitting: Cardiology

## 2018-07-14 NOTE — Progress Notes (Signed)
Pt aware.

## 2018-07-15 ENCOUNTER — Encounter: Payer: Self-pay | Admitting: Cardiology

## 2018-07-15 ENCOUNTER — Other Ambulatory Visit: Payer: Self-pay

## 2018-07-15 ENCOUNTER — Ambulatory Visit (INDEPENDENT_AMBULATORY_CARE_PROVIDER_SITE_OTHER): Payer: 59 | Admitting: Cardiology

## 2018-07-15 VITALS — BP 139/70 | HR 35 | Ht 72.0 in | Wt 265.0 lb

## 2018-07-15 DIAGNOSIS — E668 Other obesity: Secondary | ICD-10-CM | POA: Diagnosis not present

## 2018-07-15 DIAGNOSIS — I493 Ventricular premature depolarization: Secondary | ICD-10-CM

## 2018-07-15 DIAGNOSIS — R0683 Snoring: Secondary | ICD-10-CM

## 2018-07-15 DIAGNOSIS — E669 Obesity, unspecified: Secondary | ICD-10-CM

## 2018-07-15 DIAGNOSIS — Z01810 Encounter for preprocedural cardiovascular examination: Secondary | ICD-10-CM | POA: Diagnosis not present

## 2018-07-15 MED ORDER — METOPROLOL SUCCINATE ER 25 MG PO TB24: 25.0000 mg | ORAL_TABLET | Freq: Every day | ORAL | 1 refills | Status: DC

## 2018-07-15 NOTE — Progress Notes (Signed)
Primary Physician:  Gaspar Garbe, MD   Patient ID: [name], male    DOB: 08/05/1965, 53 y.o.   MRN: [id number]  Subjective:    Chief Complaint  Patient presents with  . PVC's    2 week f/u , testing   This visit type was conducted due to national recommendations for restrictions regarding the COVID-19 Pandemic (e.g. social distancing).  This format is felt to be most appropriate for this patient at this time.  All issues noted in this document were discussed and addressed.  No physical exam was performed (except for noted visual exam findings with Telehealth visits).  The patient has consented to conduct a Telehealth visit and understands insurance will be billed.   I discussed the limitations of evaluation and management by telemedicine and the availability of in person appointments. The patient expressed understanding and agreed to proceed.  Virtual Visit via Video Note is as below  I connected with Chad Graves, on 07/15/18 at 1500 by telephone and verified that I am speaking with the correct person using two identifiers. Unable to perform video visit as patient did not have equipment.    I have discussed with the patient regarding the safety during COVID Pandemic and steps and precautions including social distancing with the patient.    HPI: Chad Graves  is a 53 y.o. male  with obesity, history of substance abuse, referred to [place] for abnormal EKG.  Patient has not been seen by physician in the last 20 years, but did recently have telehealth visit one month ago and states that his labs were all normal. He denies any history of hypertension, diabetes, or hyperlipidemia. He has not been tested for sleep apnea. Wife does report snoring. States that he gives plasma regularly and has never been rejected for this.  He recently fell while on the job and dislocated his left shoulder, preoperative workup, found to have frequent PVC's; therefore, surgery was cancelled. He  underwent echocardiogram and lexiscan nuclear stress test and now presents for results.   He is asymptomatic in regard to his PVC's. Has occasional atypical chest pain that has been present for several years. No associated shortness of breath or pain radiation. Currently in significant pain from his shoulder. He denies any dyspnea on exertion, palpitations, dizziness, syncope, leg swelling, no symptoms suggestive of claudication or TIA.  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 Does have periods where he does not get enough sleep and works for 20+ hours straight.  Patient is fairly active with his job that includes lots of walking. He is hoping to start losing weight with diet and exercise. Denies any former or current tobacco or alcohol use. He does use small amount of marijuana every other day. Previously used coccaine for about 4 years in his 53's. He has been abstinent since that time.  Patient reports that his son as SVT and his age 68. Father had aneurysm rupture in his 77's he also had CKD. Paternal grandfather died from MI in his 1's as well.   No past medical history on file.  Past Surgical History:  Procedure Laterality Date  . NOSE SURGERY    . REPLACEMENT TOTAL KNEE      Social History   Socioeconomic History  . Marital status: Married    Spouse name: Not on file  . Number of children: 1  . Years of education: Not on file  . Highest education level: Not on file  Occupational History  . Not on file  Social Needs  . Financial resource strain: Not on file  . Food insecurity:    Worry: Not on file    Inability: Not on file  . Transportation needs:    Medical: Not on file    Non-medical: Not on file  Tobacco Use  . Smoking status: Never Smoker  . Smokeless tobacco: Never Used  Substance and Sexual Activity  . Alcohol use: No  . Drug use: No  . Sexual activity: Not on file  Lifestyle  . Physical activity:    Days per week: Not on file    Minutes per  session: Not on file  . Stress: Not on file  Relationships  . Social connections:    Talks on phone: Not on file    Gets together: Not on file    Attends religious service: Not on file    Active member of club or organization: Not on file    Attends meetings of clubs or organizations: Not on file    Relationship status: Not on file  . Intimate partner violence:    Fear of current or ex partner: Not on file    Emotionally abused: Not on file    Physically abused: Not on file    Forced sexual activity: Not on file  Other Topics Concern  . Not on file  Social History Narrative  . Not on file    Review of Systems  Constitution: Positive for weight gain. Negative for decreased appetite, malaise/fatigue and weight loss.  Eyes: Negative for visual disturbance.  Cardiovascular: Negative for chest pain, claudication, dyspnea on exertion, leg swelling, orthopnea, palpitations and syncope.  Respiratory: Positive for snoring. Negative for hemoptysis and wheezing.   Endocrine: Negative for cold intolerance and heat intolerance.  Hematologic/Lymphatic: Does not bruise/bleed easily.  Skin: Negative for nail changes.  Musculoskeletal: Positive for joint pain. Negative for muscle weakness and myalgias.  Gastrointestinal: Negative for abdominal pain, change in bowel habit, nausea and vomiting.  Neurological: Negative for difficulty with concentration, dizziness, focal weakness and headaches.  Psychiatric/Behavioral: Negative for altered mental status and suicidal ideas.  All other systems reviewed and are negative.     Objective:  Blood pressure 139/70, pulse (!) 35, height 6' (1.829 m), weight 265 lb (120.2 kg). Body mass index is 35.94 kg/m.    Physical exam not performed or limited due to virtual visit.    Please see exam details from prior visit is as below.   Physical Exam  Constitutional: He is oriented to person, place, and time. Vital signs are normal. He appears well-developed  and well-nourished.  HENT:  Head: Normocephalic and atraumatic.  Neck: Normal range of motion.  Cardiovascular: Normal rate, regular rhythm, normal heart sounds and intact distal pulses. Frequent extrasystoles are present.  Pulmonary/Chest: Effort normal and breath sounds normal. No accessory muscle usage. No respiratory distress.  Abdominal: Soft. Bowel sounds are normal.  Musculoskeletal: Normal range of motion.  Neurological: He is alert and oriented to person, place, and time.  Skin: Skin is warm and dry.  Vitals reviewed.  Radiology: No results found.  Laboratory examination:    CMP Latest Ref Rng & Units 08/17/2014 03/14/2009 03/12/2009  Glucose 65 - 99 mg/dL 564(P131(H) 329(J103(H) 91  BUN 6 - 20 mg/dL 12 10 12   Creatinine 0.61 - 1.24 mg/dL 1.880.80 4.160.79 6.060.81  Sodium 135 - 145 mmol/L 139 138 138  Potassium 3.5 - 5.1 mmol/L 4.1 3.7 4.1  Chloride 101 -  111 mmol/L 103 104 104  CO2 22 - 32 mmol/L 28 28 27   Calcium 8.9 - 10.3 mg/dL 9.0 6.7(M) 9.1   CBC Latest Ref Rng & Units 08/17/2014 03/14/2009 03/12/2009  WBC 4.0 - 10.5 K/uL 11.4(H) 7.8 5.6  Hemoglobin 13.0 - 17.0 g/dL 09.4 70.9 DELTA CHECK NOTED RESULT REPEATED AND VERIFIED(L) 13.7  Hematocrit 39.0 - 52.0 % 43.5 33.8(L) 41.2  Platelets 150 - 400 K/uL 137(L) 194 DELTA CHECK NOTED SPECIMEN CHECKED FOR CLOTS 253   Lipid Panel  No results found for: CHOL, TRIG, HDL, CHOLHDL, VLDL, LDLCALC, LDLDIRECT HEMOGLOBIN A1C No results found for: HGBA1C, MPG TSH No results for input(s): TSH in the last 8760 hours.  PRN Meds:. There are no discontinued medications. No outpatient medications have been marked as taking for the 07/15/18 encounter (Office Visit) with Toniann Fail, NP.    Cardiac Studies:   Echo 07/06/2018:  1. The left ventricle has normal systolic function, with an ejection fraction of 55-60%. There is mild concentric left ventricular hypertrophy. Left ventricular diastolic parameters were normal. No evidence of left ventricular  regional wall motion  abnormalities.  2. The right ventricle has normal systolic function. The cavity was normal. There is no increase in right ventricular wall thickness. Right ventricular systolic pressure could not be assessed.  3. Left atrial size was severely dilated.  4. Right atrial size was mildly dilated.  Lexiscan Sestamibi Stress Test 07/09/2018: Resting EKG demonstrates normal sinus rhythm, frequent unifocal PVCs in the pattern of bigeminy.Nondiagnostic ECG stress, no change with Lexiscan infusion.  The perfusion imaging study demonstrates soft tissue attenuation artifact in the inferior wall. There is no demonstrable ischemia or scar. LV systolic function was normal at 52% without wall motion abnormality. This is a low risk study.  Assessment:   No diagnosis found.  EKG 07/02/2018: Normal sinus rhythm at 77 bpm, PVC's in bigeminal pattern, left atrial enlargement, no evidence of ischemia. Abnormal EKG  Recommendations:   I discussed recently obtained Lexiscan nuclear stress test with the patient and his wife, no evidence of ischemia by stress testing and was considered low risk study.  Echocardiogram revealed normal LVEF, mild LVH, severe left atrial enlargement as well as mild right atrial enlargement. Patient denies any history of hypertension or hyperlipidemia. This patient had low risk nuclear stress test, feel that he can proceed with surgery from cardiac standpoint as he is low risk for perioperative complications.    He is asymptomatic in regards to his PVCs, but in view of frequency, will start him on metoprolol succinate 25 mg daily.  He will monitor his blood pressure at home with this.  I suspect left atrial enlargement is related to underlying sleep apnea.  He mentions snoring and persistent fatigue.  In view of the symptoms, his frequent PVC's,  and left atrial enlargement, feel that he would benefit from evaluation for possible sleep apnea and possible sleep study.   Will make referral for this.  I have encouraged him to continue to make diet modifications for weight loss.  I will see him back in 4 weeks for follow-up on his PVCs.    Toniann Fail, MSN, APRN, FNP-C [facility] Office: [phone] Fax: [phone]

## 2018-07-16 ENCOUNTER — Ambulatory Visit: Payer: Self-pay | Admitting: Cardiology

## 2018-07-23 ENCOUNTER — Other Ambulatory Visit: Payer: Self-pay

## 2018-07-26 ENCOUNTER — Other Ambulatory Visit: Payer: Self-pay

## 2018-07-26 ENCOUNTER — Encounter (HOSPITAL_BASED_OUTPATIENT_CLINIC_OR_DEPARTMENT_OTHER): Payer: Self-pay | Admitting: *Deleted

## 2018-07-29 ENCOUNTER — Other Ambulatory Visit (HOSPITAL_COMMUNITY)
Admission: RE | Admit: 2018-07-29 | Discharge: 2018-07-29 | Disposition: A | Discharge status: Home / Self Care | Payer: 59 | Source: Ambulatory Visit | Attending: Orthopaedic Surgery | Admitting: Orthopaedic Surgery

## 2018-07-29 DIAGNOSIS — Z1159 Encounter for screening for other viral diseases: Secondary | ICD-10-CM | POA: Diagnosis not present

## 2018-07-29 LAB — SARS CORONAVIRUS 2 (TAT 6-24 HRS): SARS Coronavirus 2: NEGATIVE

## 2018-07-30 ENCOUNTER — Encounter (HOSPITAL_COMMUNITY): Payer: Self-pay | Admitting: *Deleted

## 2018-07-30 ENCOUNTER — Other Ambulatory Visit: Payer: Self-pay

## 2018-07-30 NOTE — Progress Notes (Signed)
Spoke with pt for pre-op call. Pt has recently been worked up for [employer] by Jeri Lager, NP with [facility] physicians. Pt denies chest pain or sob. Pt has started on Metoprolol. Pt states he does not have HTN or Diabetes.  Instructed pt that he is not to eat food after midnight Sunday, he may have clear liquids until 11:00 AM and he needs to drink the Pre-Surgery Ensure by 11:00 AM. Pt's wife will come by today to pick up the drink. Pt voiced understanding  Pt had Covid test done yesterday, it was negative.   Coronavirus Screening  Have you experienced the following symptoms:  Cough NO Fever (>100.61F)  NO Runny nose NO Sore throat NO Difficulty breathing/shortness of breath  NO  Have you or a family member traveled in the last 14 days and where? NO   Patient reminded that hospital visitation restrictions are in effect and the importance of the restrictions.

## 2018-08-02 ENCOUNTER — Ambulatory Visit (HOSPITAL_COMMUNITY): Payer: 59 | Admitting: Anesthesiology

## 2018-08-02 ENCOUNTER — Other Ambulatory Visit: Payer: Self-pay

## 2018-08-02 ENCOUNTER — Ambulatory Visit (HOSPITAL_COMMUNITY)
Admission: RE | Admit: 2018-08-02 | Discharge: 2018-08-02 | Disposition: A | Discharge status: Home / Self Care | Payer: 59 | Attending: Orthopaedic Surgery | Admitting: Orthopaedic Surgery

## 2018-08-02 ENCOUNTER — Encounter (HOSPITAL_COMMUNITY)
Admission: RE | Disposition: A | Discharge status: Home / Self Care | Payer: Self-pay | Source: Home / Self Care | Attending: Orthopaedic Surgery

## 2018-08-02 ENCOUNTER — Encounter (HOSPITAL_COMMUNITY): Payer: Self-pay | Admitting: *Deleted

## 2018-08-02 DIAGNOSIS — R0683 Snoring: Secondary | ICD-10-CM | POA: Diagnosis not present

## 2018-08-02 DIAGNOSIS — Z79899 Other long term (current) drug therapy: Secondary | ICD-10-CM | POA: Insufficient documentation

## 2018-08-02 DIAGNOSIS — Z96652 Presence of left artificial knee joint: Secondary | ICD-10-CM | POA: Insufficient documentation

## 2018-08-02 DIAGNOSIS — M7542 Impingement syndrome of left shoulder: Secondary | ICD-10-CM | POA: Insufficient documentation

## 2018-08-02 DIAGNOSIS — X58XXXA Exposure to other specified factors, initial encounter: Secondary | ICD-10-CM | POA: Diagnosis not present

## 2018-08-02 DIAGNOSIS — Z8249 Family history of ischemic heart disease and other diseases of the circulatory system: Secondary | ICD-10-CM | POA: Insufficient documentation

## 2018-08-02 DIAGNOSIS — I10 Essential (primary) hypertension: Secondary | ICD-10-CM | POA: Diagnosis not present

## 2018-08-02 DIAGNOSIS — M19012 Primary osteoarthritis, left shoulder: Secondary | ICD-10-CM | POA: Insufficient documentation

## 2018-08-02 DIAGNOSIS — S46012A Strain of muscle(s) and tendon(s) of the rotator cuff of left shoulder, initial encounter: Secondary | ICD-10-CM | POA: Insufficient documentation

## 2018-08-02 HISTORY — PX: SHOULDER ARTHROSCOPY WITH SUBACROMIAL DECOMPRESSION, ROTATOR CUFF REPAIR AND BICEP TENDON REPAIR: SHX5687

## 2018-08-02 HISTORY — DX: Snoring: R06.83

## 2018-08-02 HISTORY — DX: Other articular cartilage disorders, left shoulder: M24.112

## 2018-08-02 HISTORY — DX: Ventricular premature depolarization: I49.3

## 2018-08-02 LAB — BASIC METABOLIC PANEL
Anion gap: 7 (ref 5–15)
BUN: 13 mg/dL (ref 6–20)
CO2: 25 mmol/L (ref 22–32)
Calcium: 9 mg/dL (ref 8.9–10.3)
Chloride: 103 mmol/L (ref 98–111)
Creatinine, Ser: 0.89 mg/dL (ref 0.61–1.24)
GFR calc Af Amer: >60 mL/min (ref >60)
GFR calc non Af Amer: >60 mL/min (ref >60)
Glucose, Bld: 137 mg/dL — ABNORMAL HIGH (ref 70–99)
Potassium: 4.4 mmol/L (ref 3.5–5.1)
Sodium: 135 mmol/L (ref 135–145)

## 2018-08-02 LAB — CBC
HCT: 46 % (ref 39.0–52.0)
Hemoglobin: 14.8 g/dL (ref 13.0–17.0)
MCH: 29.1 pg (ref 26.0–34.0)
MCHC: 32.2 g/dL (ref 30.0–36.0)
MCV: 90.4 fL (ref 80.0–100.0)
Platelets: 202 10*3/uL (ref 150–400)
RBC: 5.09 MIL/uL (ref 4.22–5.81)
RDW: 13.7 % (ref 11.5–15.5)
WBC: 8 10*3/uL (ref 4.0–10.5)
nRBC: 0 % (ref 0.0–0.2)

## 2018-08-02 SURGERY — SHOULDER ARTHROSCOPY WITH SUBACROMIAL DECOMPRESSION, ROTATOR CUFF REPAIR AND BICEP TENDON REPAIR
Anesthesia: General | Laterality: Left

## 2018-08-02 MED ORDER — ROCURONIUM BROMIDE 10 MG/ML (PF) SYRINGE
PREFILLED_SYRINGE | INTRAVENOUS | Status: AC
  Filled 2018-08-02: qty 10

## 2018-08-02 MED ORDER — LACTATED RINGERS IV SOLN
INTRAVENOUS | Status: DC
  Administered 2018-08-02: 12:00:00 via INTRAVENOUS

## 2018-08-02 MED ORDER — LIDOCAINE 2% (20 MG/ML) 5 ML SYRINGE
INTRAMUSCULAR | Status: AC
  Filled 2018-08-02: qty 5

## 2018-08-02 MED ORDER — ACETAMINOPHEN 500 MG PO TABS
1000.0000 mg | ORAL_TABLET | Freq: Once | ORAL | Status: AC
  Administered 2018-08-02: 1000 mg via ORAL

## 2018-08-02 MED ORDER — ACETAMINOPHEN 500 MG PO TABS
ORAL_TABLET | ORAL | Status: AC
  Administered 2018-08-02: 1000 mg via ORAL
  Filled 2018-08-02: qty 2

## 2018-08-02 MED ORDER — EPHEDRINE 5 MG/ML INJ
INTRAVENOUS | Status: AC
  Filled 2018-08-02: qty 10

## 2018-08-02 MED ORDER — MIDAZOLAM HCL 2 MG/2ML IJ SOLN
INTRAMUSCULAR | Status: AC
  Filled 2018-08-02: qty 2

## 2018-08-02 MED ORDER — SUCCINYLCHOLINE CHLORIDE 200 MG/10ML IV SOSY
PREFILLED_SYRINGE | INTRAVENOUS | Status: DC | PRN
  Administered 2018-08-02: 140 mg via INTRAVENOUS

## 2018-08-02 MED ORDER — ROCURONIUM BROMIDE 50 MG/5ML IV SOSY
PREFILLED_SYRINGE | INTRAVENOUS | Status: DC | PRN
  Administered 2018-08-02: 50 mg via INTRAVENOUS

## 2018-08-02 MED ORDER — ONDANSETRON HCL 4 MG/2ML IJ SOLN
INTRAMUSCULAR | Status: AC
  Filled 2018-08-02: qty 2

## 2018-08-02 MED ORDER — SODIUM CHLORIDE 0.9 % IR SOLN
Status: DC | PRN
  Administered 2018-08-02 (×6): 3000 mL

## 2018-08-02 MED ORDER — MIDAZOLAM HCL 2 MG/2ML IJ SOLN
2.0000 mg | Freq: Once | INTRAMUSCULAR | Status: AC
  Administered 2018-08-02: 13:00:00 2 mg via INTRAVENOUS

## 2018-08-02 MED ORDER — FENTANYL CITRATE (PF) 100 MCG/2ML IJ SOLN
INTRAMUSCULAR | Status: AC
  Administered 2018-08-02: 100 ug via INTRAVENOUS
  Filled 2018-08-02: qty 2

## 2018-08-02 MED ORDER — FENTANYL CITRATE (PF) 250 MCG/5ML IJ SOLN
INTRAMUSCULAR | Status: AC
  Filled 2018-08-02: qty 5

## 2018-08-02 MED ORDER — PROPOFOL 10 MG/ML IV BOLUS
INTRAVENOUS | Status: AC
  Filled 2018-08-02: qty 20

## 2018-08-02 MED ORDER — EPHEDRINE SULFATE-NACL 50-0.9 MG/10ML-% IV SOSY
PREFILLED_SYRINGE | INTRAVENOUS | Status: DC | PRN
  Administered 2018-08-02: 10 mg via INTRAVENOUS

## 2018-08-02 MED ORDER — PHENYLEPHRINE 40 MCG/ML (10ML) SYRINGE FOR IV PUSH (FOR BLOOD PRESSURE SUPPORT)
PREFILLED_SYRINGE | INTRAVENOUS | Status: AC
  Filled 2018-08-02: qty 10

## 2018-08-02 MED ORDER — GLYCOPYRROLATE 0.2 MG/ML IJ SOLN
INTRAMUSCULAR | Status: DC | PRN
  Administered 2018-08-02: 0.2 mg via INTRAVENOUS

## 2018-08-02 MED ORDER — FENTANYL CITRATE (PF) 100 MCG/2ML IJ SOLN
100.0000 ug | Freq: Once | INTRAMUSCULAR | Status: AC
  Administered 2018-08-02: 13:00:00 100 ug via INTRAVENOUS

## 2018-08-02 MED ORDER — BUPIVACAINE HCL (PF) 0.25 % IJ SOLN
INTRAMUSCULAR | Status: AC
  Filled 2018-08-02: qty 30

## 2018-08-02 MED ORDER — BUPIVACAINE LIPOSOME 1.3 % IJ SUSP
INTRAMUSCULAR | Status: DC | PRN
  Administered 2018-08-02: 10 mL via PERINEURAL

## 2018-08-02 MED ORDER — OXYCODONE HCL 5 MG PO TABS: ORAL_TABLET | ORAL | 0 refills | Status: AC

## 2018-08-02 MED ORDER — SUCCINYLCHOLINE CHLORIDE 200 MG/10ML IV SOSY
PREFILLED_SYRINGE | INTRAVENOUS | Status: AC
  Filled 2018-08-02: qty 10

## 2018-08-02 MED ORDER — CEFAZOLIN SODIUM 10 G IJ SOLR
3.0000 g | INTRAMUSCULAR | Status: AC
  Administered 2018-08-02: 3 g via INTRAVENOUS
  Filled 2018-08-02: qty 3

## 2018-08-02 MED ORDER — PROPOFOL 10 MG/ML IV BOLUS
INTRAVENOUS | Status: DC | PRN
  Administered 2018-08-02: 180 mg via INTRAVENOUS

## 2018-08-02 MED ORDER — 0.9 % SODIUM CHLORIDE (POUR BTL) OPTIME
TOPICAL | Status: DC | PRN
  Administered 2018-08-02: 1000 mL

## 2018-08-02 MED ORDER — ACETAMINOPHEN 500 MG PO TABS: 1000.0000 mg | ORAL_TABLET | Freq: Three times a day (TID) | ORAL | 0 refills | Status: AC

## 2018-08-02 MED ORDER — MIDAZOLAM HCL 2 MG/2ML IJ SOLN
INTRAMUSCULAR | Status: AC
  Administered 2018-08-02: 2 mg via INTRAVENOUS
  Filled 2018-08-02: qty 2

## 2018-08-02 MED ORDER — BUPIVACAINE HCL (PF) 0.5 % IJ SOLN
INTRAMUSCULAR | Status: DC | PRN
  Administered 2018-08-02: 15 mL via PERINEURAL

## 2018-08-02 MED ORDER — CELECOXIB 100 MG PO CAPS: 100.0000 mg | ORAL_CAPSULE | Freq: Two times a day (BID) | ORAL | 2 refills | Status: DC

## 2018-08-02 MED ORDER — SODIUM CHLORIDE 0.9 % IV SOLN
INTRAVENOUS | Status: DC | PRN
  Administered 2018-08-02: 25 ug/min via INTRAVENOUS

## 2018-08-02 MED ORDER — PROMETHAZINE HCL 25 MG/ML IJ SOLN: 6.2500 mg | INTRAMUSCULAR | Status: DC | PRN

## 2018-08-02 MED ORDER — FENTANYL CITRATE (PF) 100 MCG/2ML IJ SOLN: 25.0000 ug | INTRAMUSCULAR | Status: DC | PRN

## 2018-08-02 MED ORDER — DEXAMETHASONE SODIUM PHOSPHATE 10 MG/ML IJ SOLN
INTRAMUSCULAR | Status: DC | PRN
  Administered 2018-08-02: 10 mg via INTRAVENOUS

## 2018-08-02 MED ORDER — ONDANSETRON HCL 4 MG PO TABS: 4.0000 mg | ORAL_TABLET | Freq: Three times a day (TID) | ORAL | 1 refills | Status: DC | PRN

## 2018-08-02 MED ORDER — ONDANSETRON HCL 4 MG/2ML IJ SOLN
INTRAMUSCULAR | Status: DC | PRN
  Administered 2018-08-02: 4 mg via INTRAVENOUS

## 2018-08-02 MED ORDER — DEXAMETHASONE SODIUM PHOSPHATE 10 MG/ML IJ SOLN
INTRAMUSCULAR | Status: AC
  Filled 2018-08-02: qty 2

## 2018-08-02 MED ORDER — SUGAMMADEX SODIUM 200 MG/2ML IV SOLN
INTRAVENOUS | Status: DC | PRN
  Administered 2018-08-02: 250 mg via INTRAVENOUS

## 2018-08-02 MED ORDER — CHLORHEXIDINE GLUCONATE 4 % EX LIQD: 60.0000 mL | Freq: Once | CUTANEOUS | Status: DC

## 2018-08-02 SURGICAL SUPPLY — 58 items
ANCHOR SUT 1.8 FBRTK KNTLS 2SU (Anchor) ×6 IMPLANT
BENZOIN TINCTURE PRP APPL 2/3 (GAUZE/BANDAGES/DRESSINGS) IMPLANT
BLADE CUTTER GATOR 3.5 (BLADE) ×3 IMPLANT
BLADE EXCALIBUR 4.0MM X 13CM (MISCELLANEOUS) ×1
BLADE EXCALIBUR 4.0X13 (MISCELLANEOUS) ×1 IMPLANT
BUR OVAL 4.0 (BURR) IMPLANT
BURR OVAL 8 FLU 4.0MM X 13CM (MISCELLANEOUS) ×1
BURR OVAL 8 FLU 4.0X13 (MISCELLANEOUS) ×1 IMPLANT
CANNULA 5.75X71 LONG (CANNULA) ×3 IMPLANT
CANNULA PASSPORT BUTTON (MISCELLANEOUS) IMPLANT
CANNULA TWIST IN 8.25X7CM (CANNULA) ×5 IMPLANT
CHLORAPREP W/TINT 26ML (MISCELLANEOUS) ×3 IMPLANT
CLOSURE STERI-STRIP 1/2X4 (GAUZE/BANDAGES/DRESSINGS) ×1
CLOSURE WOUND 1/2 X4 (GAUZE/BANDAGES/DRESSINGS)
CLSR STERI-STRIP ANTIMIC 1/2X4 (GAUZE/BANDAGES/DRESSINGS) ×1 IMPLANT
COVER WAND RF STERILE (DRAPES) ×3 IMPLANT
DRAPE STERI 35X30 U-POUCH (DRAPES) ×3 IMPLANT
DRAPE U-SHAPE 47X51 STRL (DRAPES) ×3 IMPLANT
DRSG EMULSION OIL 3X3 NADH (GAUZE/BANDAGES/DRESSINGS) ×3 IMPLANT
ELECT REM PT RETURN 9FT ADLT (ELECTROSURGICAL)
ELECTRODE REM PT RTRN 9FT ADLT (ELECTROSURGICAL) IMPLANT
GAUZE SPONGE 4X4 12PLY STRL (GAUZE/BANDAGES/DRESSINGS) ×4 IMPLANT
GLOVE BIOGEL PI IND STRL 8 (GLOVE) ×1 IMPLANT
GLOVE BIOGEL PI INDICATOR 8 (GLOVE) ×2
GLOVE ECLIPSE 8.0 STRL XLNG CF (GLOVE) ×6 IMPLANT
GOWN STRL REUS W/ TWL LRG LVL3 (GOWN DISPOSABLE) ×2 IMPLANT
GOWN STRL REUS W/ TWL XL LVL3 (GOWN DISPOSABLE) ×1 IMPLANT
GOWN STRL REUS W/TWL LRG LVL3 (GOWN DISPOSABLE) ×6
GOWN STRL REUS W/TWL XL LVL3 (GOWN DISPOSABLE) ×5 IMPLANT
IMPL SPEEDBRIDGE KIT (Orthopedic Implant) IMPLANT
IMPLANT SPEEDBRIDGE KIT (Orthopedic Implant) ×3 IMPLANT
KIT BASIN OR (CUSTOM PROCEDURE TRAY) ×3 IMPLANT
KIT STR SPEAR 1.8 FBRTK DISP (KITS) ×2 IMPLANT
LASSO 90 CVE QUICKPAS (DISPOSABLE) ×2 IMPLANT
MANIFOLD NEPTUNE II (INSTRUMENTS) ×3 IMPLANT
NDL SCORPION MULTI FIRE (NEEDLE) IMPLANT
NEEDLE SCORPION MULTI FIRE (NEEDLE) ×3 IMPLANT
NS IRRIG 1000ML POUR BTL (IV SOLUTION) IMPLANT
PACK ARTHROSCOPY DSU (CUSTOM PROCEDURE TRAY) ×3 IMPLANT
PACK SHOULDER (CUSTOM PROCEDURE TRAY) ×6 IMPLANT
PAD ABD 8X10 STRL (GAUZE/BANDAGES/DRESSINGS) ×3 IMPLANT
PASSPORT BUTTON CANNULA (MISCELLANEOUS) ×3
RESTRAINT HEAD UNIVERSAL NS (MISCELLANEOUS) ×3 IMPLANT
SLING ARM FOAM STRAP LRG (SOFTGOODS) IMPLANT
SLING ARM FOAM STRAP MED (SOFTGOODS) IMPLANT
SLING ARM IMMOBILIZER LRG (SOFTGOODS) IMPLANT
SLING ARM IMMOBILIZER MED (SOFTGOODS) IMPLANT
SLING ARM XL FOAM STRAP (SOFTGOODS) IMPLANT
SPONGE LAP 18X18 RF (DISPOSABLE) ×2 IMPLANT
SPONGE LAP 4X18 RFD (DISPOSABLE) ×2 IMPLANT
STRIP CLOSURE SKIN 1/2X4 (GAUZE/BANDAGES/DRESSINGS) IMPLANT
SUPPORT WRAP ARM LG (MISCELLANEOUS) ×2 IMPLANT
SUT ETHILON 3 0 PS 1 (SUTURE) ×3 IMPLANT
SUT TIGER TAPE 7 IN WHITE (SUTURE) IMPLANT
TAPE CLOTH SURG 6X10 WHT LF (GAUZE/BANDAGES/DRESSINGS) ×2 IMPLANT
TAPE FIBER 2MM 7IN #2 BLUE (SUTURE) IMPLANT
WAND HAND CNTRL MULTIVAC 90 (MISCELLANEOUS) ×3 IMPLANT
WATER STERILE IRR 1000ML POUR (IV SOLUTION) ×3 IMPLANT

## 2018-08-02 NOTE — Anesthesia Procedure Notes (Signed)
Procedure Name: Intubation Date/Time: 08/02/2018 1:12 PM Performed by: Shirlyn Goltz, CRNA Pre-anesthesia Checklist: Patient identified, Emergency Drugs available, Suction available and Patient being monitored Patient Re-evaluated:Patient Re-evaluated prior to induction Oxygen Delivery Method: Circle system utilized Preoxygenation: Pre-oxygenation with 100% oxygen Induction Type: IV induction and Rapid sequence Ventilation: Mask ventilation without difficulty Laryngoscope Size: Mac and 4 Grade View: Grade I Tube type: Oral Tube size: 7.5 mm Number of attempts: 1 Airway Equipment and Method: Stylet Placement Confirmation: ETT inserted through vocal cords under direct vision,  positive ETCO2 and breath sounds checked- equal and bilateral Secured at: 22 cm Tube secured with: Tape Dental Injury: Teeth and Oropharynx as per pre-operative assessment

## 2018-08-02 NOTE — Discharge Instructions (Signed)

## 2018-08-02 NOTE — Anesthesia Preprocedure Evaluation (Signed)
Anesthesia Evaluation  Patient identified by MRN, date of birth, ID band Patient awake    Reviewed: Allergy & Precautions, NPO status , Patient's Chart, lab work & pertinent test results  Airway Mallampati: II  TM Distance: >3 FB Neck ROM: Full    Dental no notable dental hx. (+) Dental Advisory Given   Pulmonary neg pulmonary ROS,    Pulmonary exam normal        Cardiovascular hypertension, Pt. on home beta blockers Normal cardiovascular exam     Neuro/Psych negative neurological ROS  negative psych ROS   GI/Hepatic negative GI ROS, Neg liver ROS,   Endo/Other  negative endocrine ROS  Renal/GU negative Renal ROS  negative genitourinary   Musculoskeletal  (+) Arthritis ,   Abdominal   Peds negative pediatric ROS (+)  Hematology negative hematology ROS (+)   Anesthesia Other Findings   Reproductive/Obstetrics negative OB ROS                             Anesthesia Physical Anesthesia Plan  ASA: II  Anesthesia Plan: General   Post-op Pain Management:    Induction: Intravenous  PONV Risk Score and Plan: 2 and Ondansetron, Dexamethasone and Scopolamine patch - Pre-op  Airway Management Planned: Oral ETT  Additional Equipment:   Intra-op Plan:   Post-operative Plan: Extubation in OR  Informed Consent: I have reviewed the patients History and Physical, chart, labs and discussed the procedure including the risks, benefits and alternatives for the proposed anesthesia with the patient or authorized representative who has indicated his/her understanding and acceptance.     Dental advisory given  Plan Discussed with: CRNA and Anesthesiologist  Anesthesia Plan Comments:         Anesthesia Quick Evaluation

## 2018-08-02 NOTE — H&P (Signed)
PREOPERATIVE H&P  Chief Complaint: LEFT SHOULDER CARTILAGE DISORDER,OSTEOARTHRITIS, IMPINGEMENT DYNDROME,STRAIN OF MUSCLES  AND TENDONS OF THE ROTATOR CUFF  HPI: Chad Graves is a 53 y.o. male who presents for preoperative history and physical with a diagnosis of LEFT SHOULDER CARTILAGE DISORDER,OSTEOARTHRITIS, IMPINGEMENT DYNDROME,STRAIN OF MUSCLES  AND TENDONS OF THE ROTATOR CUFF. Symptoms are rated as moderate to severe, and have been worsening.  This is significantly impairing activities of daily living.  Please see my clinic note for full details on this patient's care.  He has elected for surgical management.   Past Medical History:  Diagnosis Date  . Articular cartilage disorder involving shoulder region, left   . PVC (premature ventricular contraction)   . Snores    Past Surgical History:  Procedure Laterality Date  . NOSE SURGERY    . REPLACEMENT TOTAL KNEE Left    Social History   Socioeconomic History  . Marital status: Married    Spouse name: Not on file  . Number of children: 1  . Years of education: Not on file  . Highest education level: Not on file  Occupational History  . Not on file  Social Needs  . Financial resource strain: Not on file  . Food insecurity    Worry: Not on file    Inability: Not on file  . Transportation needs    Medical: Not on file    Non-medical: Not on file  Tobacco Use  . Smoking status: Never Smoker  . Smokeless tobacco: Never Used  Substance and Sexual Activity  . Alcohol use: No  . Drug use: Yes    Types: Marijuana    Comment: last smoked 07-21-18  . Sexual activity: Not on file  Lifestyle  . Physical activity    Days per week: Not on file    Minutes per session: Not on file  . Stress: Not on file  Relationships  . Social Herbalist on phone: Not on file    Gets together: Not on file    Attends religious service: Not on file    Active member of club or organization: Not on file    Attends meetings of  clubs or organizations: Not on file    Relationship status: Not on file  Other Topics Concern  . Not on file  Social History Narrative  . Not on file   Family History  Problem Relation Age of Onset  . Heart attack Father   . Heart disease Father    No Known Allergies Prior to Admission medications   Medication Sig Start Date End Date Taking? Authorizing Provider  acetaminophen (TYLENOL) 500 MG tablet Take 500 mg by mouth at bedtime as needed for moderate pain.   Yes [provider]  metoprolol succinate (TOPROL-XL) 25 MG 24 hr tablet Take 1 tablet (25 mg total) by mouth daily. Take with or immediately following a meal. Patient taking differently: Take 25 mg by mouth at bedtime. Take with or immediately following a meal. 07/15/18  Yes Miquel Dunn, NP  traMADol (ULTRAM) 50 MG tablet Take 100 mg by mouth at bedtime as needed for moderate pain.    Yes [provider]     Positive ROS: All other systems have been reviewed and were otherwise negative with the exception of those mentioned in the HPI and as above.  Physical Exam: General: Alert, no acute distress Cardiovascular: No pedal edema Respiratory: No cyanosis, no use of accessory musculature GI: No organomegaly, abdomen is  soft and non-tender Skin: No lesions in the area of chief complaint Neurologic: Sensation intact distally Psychiatric: Patient is competent for consent with normal mood and affect Lymphatic: No axillary or cervical lymphadenopathy  MUSCULOSKELETAL: L shoulder instability, wwp hand.  Assessment: LEFT SHOULDER CARTILAGE DISORDER,OSTEOARTHRITIS, IMPINGEMENT DYNDROME,STRAIN OF MUSCLES  AND TENDONS OF THE ROTATOR CUFF  Plan: Plan for Procedure(s): LEFT SHOULDER ARTHROSCOPY DEBRIDEMENT,SUBACROMIAL DECOMPRESSION,DISTAL CALVICAL EXCISION, ROTATOR CURR REPAIR, BICEP TENODESIS, BANKART REPAIR  The risks benefits and alternatives were discussed with the patient including but not limited to  the risks of nonoperative treatment, versus surgical intervention including infection, bleeding, nerve injury,  blood clots, cardiopulmonary complications, morbidity, mortality, among others, and they were willing to proceed.   Bjorn Pippinax T Robena Ewy, MD  08/02/2018 12:57 PM

## 2018-08-02 NOTE — Op Note (Signed)
Orthopaedic Surgery Operative Note (CSN: [id number])  Chad Graves  Apr 30, 1965 Date of Surgery: 08/02/2018   Diagnoses:  Left shoulder traumatic rotator cuff tear with anterior bony Bankart, impingement and AC arthrosis  Procedure: Left shoulder arthroscopic rotator cuff repair Left shoulder arthroscopic Bankart repair Left shoulder arthroscopic distal clavicle excision Left shoulder arthroscopic subacromial decompression Left shoulder arthroscopic extensive debridement   Operative Finding Successful completion of planned procedure.  Patient had extremely difficult injury to treat.  He had a anterior labral tear but he was relatively stiff preop.  Forward elevation was only 110 degrees with external rotation to 15 degrees.  We had to balance his stability with his underlying stiffness.  We did a labral repair anteriorly but were careful not to release too much in the way of the interval.  Subscapularis looked intact however it was initially assessed on MRI as possibly being torn, we think this is likely labral tissue that they were noting.  Supraspinatus had a full-thickness tear leading up to the infraspinatus.  Reasonable tissue quality was obtained.  Patient had significant swelling due to his capsular injury which made the case much more difficult.  Biceps stayed located within the groove and they were overhanging osteophytes and we did not feel that it was appropriate to do a biceps tenodesis based on this.  Cartilage surfaces were intact, posterior cuff was intact, biceps anchor was loose.  Post-operative plan: The patient will be nonweightbearing in a sling with therapy to start 2 to 3 weeks after his surgery.  The patient will be charged home.  DVT prophylaxis not indicated in ambulatory upper extremity patient without risk factors.  Pain control with PRN pain medication preferring oral medicines.  Follow up plan will be scheduled in approximately 7 days for incision check and  XR.  Post-Op Diagnosis: Same Surgeons:Primary: Hiram Gash, MD Assistants: Joya Gaskins, OPAC Location: [facility] OR ROOM [phone] Anesthesia: General with Exparel block Antibiotics: Ancef 2g preop Tourniquet time: * No tourniquets in log * Estimated Blood Loss: Minimal Complications: None Specimens: None Implants: Implant Name Type Inv. Item Serial No. Manufacturer Lot No. LRB No. Used Action  ANCHOR SUTURE FIBERTAK 1.8 - [id number] Anchor ANCHOR SUTURE FIBERTAK 1.8  [employer] [id number] Left 3 Implanted  IMPLANT SPEEDBRIDGE KIT - [id number] Orthopedic Implant IMPLANT SPEEDBRIDGE KIT  [place] [id number] Left 1 Implanted    Indications for Surgery:   Chad Graves is a 53 y.o. male with anterior shoulder dislocation with traumatic rotator cuff tear and bony Bankart lesion.  Benefits and risks of operative and nonoperative management were discussed prior to surgery with patient/guardian(s) and informed consent form was completed.  Specific risks including infection, need for additional surgery, continued stiffness, rotator cuff re-tear and continued pain   Procedure:   Patient was correctly identified in the preoperative holding area and operative site marked.  Patient brought to OR and positioned beachchair on an [place] table ensuring that all bony prominences were padded and the head was in an appropriate location.  Anesthesia was induced and the operative shoulder was prepped and draped in the usual sterile fashion.  She was positioned beachchair on [employer].  Timeout was called preincision.  A standard posterior viewing portal was made after localizing the portal with a spinal needle.  An anterior accessory portal was also made.  After clearing the articular space the camera was positioned in the subacromial space.  Findings above.  Extensive debridement was performed of the articular surface including the interval,  labral stump where the biceps anchor had been, the bony bed of the bony  Bankart.  This point we used a relatively high lateral interval portal to prepare bony lesion where the tear glenoid fracture was noted.  We are able to mobilize the tissue however this tissue was extraordinarily damaged and of poor quality.  The labrum itself was intact however the Seemed almost irreparably torn.  This led to extensive fluid extravasation and difficulty with swelling control.  We were able to get a good repair with 2 anchors of the anterior labral tissue though the bony fragments itself are relatively comminuted.  There is no obvious bony fragment to really pull back into place.  Once this was complete we checked her repair and were satisfied.  We checked her subscapularis which was intact.  Then moved along to the rotator cuff portion of the case.  Biceps tenotomy was performed and the stump was debrided back to a stable base.  Subacromial decompression: We made a lateral portal with spinal needle guidance. We then proceeded to debride bursal tissue extensively with a shaver and arthrocare device. At that point we continued to identify the borders of the acromion and identify the spur. We then carefully preserved the deltoid fascia and used a burr to convert the type 2 acromion to a Type 1 flat acromion without issue.  Distal Clavicle resection:  The scope was placed in the subacromial space from the posterior portal.  A hemostat was placed through the anterior portal and we spread at the [facility] joint.  A burr was then inserted and 10 mm of distal clavicle was resected taking care to avoid damage to the capsule around the joint and avoiding overhanging bone posteriorly.    Arthroscopic Rotator Cuff Repair: Tuberosity was prepared with a burr to a bleeding bed.  Following completion of the above we placed 2 4.7 Swivelock anchor loaded with a tape at inserted at the medial articular margin and an scorpion suture passing device, shuttled  utures medially in a horizontal mattress suture  configuration.  We then tied using arthroscopic knot tying techniques  each suture to its partner reducing the tendon at the prepared insertion site.  The fiber tape was not tied. With a medial row suture limbs then incorporated, 4 anteriorly and  4 posteriorly, into each of two 4.75 PEEK SwiveLock anchors, each placed 8 to 10 mm below the tip of the tuberosity and spanning anterior-posterior width of the tear with care to avoid over tensioning.   The incisions were closed with absorbable monocryl, benzoin and steri strips.  A sterile dressing was placed along with a sling. The patient was awoken from general anesthesia and taken to the PACU in stable condition without complication.   Joya Gaskins, OPA-C, present and scrubbed throughout the case, critical for completion in a timely fashion, and for retraction, instrumentation, closure.

## 2018-08-02 NOTE — Transfer of Care (Signed)
Immediate Anesthesia Transfer of Care Note  Patient: Chad Graves  Procedure(s) Performed: LEFT SHOULDER ARTHROSCOPY DEBRIDEMENT,SUBACROMIAL DECOMPRESSION,DISTAL CALVICAL EXCISION, ROTATOR CURR REPAIR, BICEP TENODESIS, BANKART REPAIR (Left )  Patient Location: PACU  Anesthesia Type:GA combined with regional for post-op pain  Level of Consciousness: awake, alert , oriented and patient cooperative  Airway & Oxygen Therapy: Patient Spontanous Breathing and Patient connected to face mask oxygen  Post-op Assessment: Report given to RN and Post -op Vital signs reviewed and stable  Post vital signs: Reviewed and stable  Last Vitals:  Vitals Value Taken Time  BP 146/86 08/02/18 1514  Temp    Pulse 55 08/02/18 1518  Resp 12 08/02/18 1518  SpO2 100 % 08/02/18 1518  Vitals shown include unvalidated device data.  Last Pain:  Vitals:   08/02/18 1515  TempSrc:   PainSc: (P) 0-No pain      Patients Stated Pain Goal: 2 (16/10/96 0454)  Complications: No apparent anesthesia complications

## 2018-08-02 NOTE — Anesthesia Procedure Notes (Signed)
Anesthesia Regional Block: Interscalene brachial plexus block   Pre-Anesthetic Checklist: ,, timeout performed, Correct Patient, Correct Site, Correct Laterality, Correct Procedure, Correct Position, site marked, Risks and benefits discussed,  Surgical consent,  Pre-op evaluation,  At surgeon's request and post-op pain management  Laterality: Left  Prep: chloraprep       Needles:  Injection technique: Single-shot  Needle Type: Echogenic Stimulator Needle     Needle Length: 5cm  Needle Gauge: 22     Additional Needles:   Narrative:  Start time: 08/02/2018 12:29 PM End time: 08/02/2018 12:39 PM Injection made incrementally with aspirations every 5 mL.  Performed by: Personally  Anesthesiologist: Duane Boston, MD  Additional Notes: Functioning IV was confirmed and monitors applied.  A 57mm 22ga echogenic arrow stimulator was used. Sterile prep and drape,hand hygiene and sterile gloves were used.Ultrasound guidance: relevant anatomy identified, needle position confirmed, local anesthetic spread visualized around nerve(s)., vascular puncture avoided.  Image printed for medical record.  Negative aspiration and negative test dose prior to incremental administration of local anesthetic. The patient tolerated the procedure well.

## 2018-08-03 NOTE — Anesthesia Postprocedure Evaluation (Signed)
Anesthesia Post Note  Patient: Chad Graves  Procedure(s) Performed: LEFT SHOULDER ARTHROSCOPY DEBRIDEMENT,SUBACROMIAL DECOMPRESSION,DISTAL CALVICAL EXCISION, ROTATOR CURR REPAIR, BICEP TENODESIS, BANKART REPAIR (Left )     Patient location during evaluation: PACU Anesthesia Type: General and Regional Level of consciousness: awake and alert, patient cooperative and oriented Pain management: pain level controlled Vital Signs Assessment: post-procedure vital signs reviewed and stable Respiratory status: spontaneous breathing, nonlabored ventilation and respiratory function stable Cardiovascular status: blood pressure returned to baseline and stable Postop Assessment: no apparent nausea or vomiting Anesthetic complications: no    Last Vitals:  Vitals:   08/02/18 1545 08/02/18 1600  BP: (!) 148/69   Pulse: (!) 55   Resp: 17   Temp:  (!) 36.2 C  SpO2: 100%     Last Pain:  Vitals:   08/02/18 1600  TempSrc:   PainSc: 0-No pain                 Jasten Guyette,E. Karlena Luebke

## 2018-08-04 ENCOUNTER — Encounter (HOSPITAL_COMMUNITY): Payer: Self-pay | Admitting: Orthopaedic Surgery

## 2018-08-09 ENCOUNTER — Other Ambulatory Visit: Payer: Self-pay

## 2018-08-09 ENCOUNTER — Ambulatory Visit (INDEPENDENT_AMBULATORY_CARE_PROVIDER_SITE_OTHER): Payer: 59 | Admitting: Cardiology

## 2018-08-09 ENCOUNTER — Encounter: Payer: Self-pay | Admitting: Cardiology

## 2018-08-09 VITALS — BP 140/79 | HR 52 | Temp 97.7°F | Ht 72.0 in | Wt 257.0 lb

## 2018-08-09 DIAGNOSIS — I493 Ventricular premature depolarization: Secondary | ICD-10-CM

## 2018-08-09 DIAGNOSIS — I10 Essential (primary) hypertension: Secondary | ICD-10-CM | POA: Diagnosis not present

## 2018-08-09 DIAGNOSIS — E668 Other obesity: Secondary | ICD-10-CM

## 2018-08-09 MED ORDER — METOPROLOL SUCCINATE ER 50 MG PO TB24: 50.0000 mg | ORAL_TABLET | Freq: Every day | ORAL | 2 refills | Status: DC

## 2018-08-09 NOTE — Progress Notes (Signed)
EKG 08/09/2018: Sinus rhythm at 91 bpm with PVC's in trigeminal pattern, left atrial enlargement, no evidence of ischemia.

## 2018-08-09 NOTE — Progress Notes (Signed)
Primary Physician:  Haywood Pao, MD   Patient ID: Chad Graves, male    DOB: [birth year], 53 y.o.   MRN: [id number]  Subjective:    Chief Complaint  Patient presents with  . PVC  . Follow-up    4wk     HPI: Chad Graves  is a 53 y.o. male  with obesity, history of substance abuse, frequent PVC's, recently evaluated by [place] for perioperative cardiac risk stratification.  Due to frequent PVC's, patient underwent echocardiogram on 07/06/18 that revealed normal LVEF, severely left atrium dilation. Lexiscan myoview stress test on 07/09/2018 was considered low risk study. He was started on Metoprolol succinate at his follow up and now presents for follow up.  Patient recently underwent surgery for left shoulder dislocation and is recovering well. He has not yet started PT. I had referred patient for possible sleep study; however, has been on hold in view of his shoulder surgery.  He is asymptomatic in regard to his PVC's. Has noticed some improvement with manually checking his pulse since being on Metoprolol. He denies any dyspnea on exertion, palpitations, dizziness, syncope, leg swelling, no symptoms suggestive of claudication or TIA.  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 Does have periods where he does not get enough sleep and works for 20+ hours straight.  Denies any former or current tobacco or alcohol use. He does use small amount of marijuana every other day. Previously used coccaine for about 4 years in his 61's. He has been abstinent since that time.  Patient reports that his son as SVT and his age 40. Father had aneurysm rupture in his 64's he also had CKD. Paternal grandfather died from MI in his 49's as well.   Past Medical History:  Diagnosis Date  . Articular cartilage disorder involving shoulder region, left   . PVC (premature ventricular contraction)   . Snores     Past Surgical History:  Procedure Laterality Date  . NOSE SURGERY    .  REPLACEMENT TOTAL KNEE Left   . SHOULDER ARTHROSCOPY WITH SUBACROMIAL DECOMPRESSION, ROTATOR CUFF REPAIR AND BICEP TENDON REPAIR Left 08/02/2018   Procedure: LEFT SHOULDER ARTHROSCOPY DEBRIDEMENT,SUBACROMIAL DECOMPRESSION,DISTAL CALVICAL EXCISION, ROTATOR CURR REPAIR, BICEP TENODESIS, BANKART REPAIR;  Surgeon: Hiram Gash, MD;  Location: [place] [facility];  Service: Orthopedics;  Laterality: Left;    Social History   Socioeconomic History  . Marital status: Married    Spouse name: Not on file  . Number of children: 1  . Years of education: Not on file  . Highest education level: Not on file  Occupational History  . Not on file  Social Needs  . Financial resource strain: Not on file  . Food insecurity    Worry: Not on file    Inability: Not on file  . Transportation needs    Medical: Not on file    Non-medical: Not on file  Tobacco Use  . Smoking status: Never Smoker  . Smokeless tobacco: Never Used  Substance and Sexual Activity  . Alcohol use: No  . Drug use: Yes    Types: Marijuana    Comment: last smoked 07-21-18  . Sexual activity: Not on file  Lifestyle  . Physical activity    Days per week: Not on file    Minutes per session: Not on file  . Stress: Not on file  Relationships  . Social Herbalist on phone: Not on file    Gets together:  Not on file    Attends religious service: Not on file    Active member of club or organization: Not on file    Attends meetings of clubs or organizations: Not on file    Relationship status: Not on file  . Intimate partner violence    Fear of current or ex partner: Not on file    Emotionally abused: Not on file    Physically abused: Not on file    Forced sexual activity: Not on file  Other Topics Concern  . Not on file  Social History Narrative  . Not on file    Review of Systems  Constitution: Positive for weight gain. Negative for decreased appetite, malaise/fatigue and weight loss.  Eyes: Negative for visual disturbance.   Cardiovascular: Negative for chest pain, claudication, dyspnea on exertion, leg swelling, orthopnea, palpitations and syncope.  Respiratory: Positive for snoring. Negative for hemoptysis and wheezing.   Endocrine: Negative for cold intolerance and heat intolerance.  Hematologic/Lymphatic: Does not bruise/bleed easily.  Skin: Negative for nail changes.  Musculoskeletal: Positive for joint pain. Negative for muscle weakness and myalgias.  Gastrointestinal: Negative for abdominal pain, change in bowel habit, nausea and vomiting.  Neurological: Negative for difficulty with concentration, dizziness, focal weakness and headaches.  Psychiatric/Behavioral: Negative for altered mental status and suicidal ideas.  All other systems reviewed and are negative.     Objective:  Blood pressure 140/79, pulse (!) 52, temperature 97.7 F (36.5 C), height 6' (1.829 m), weight 257 lb (116.6 kg), SpO2 96 %. Body mass index is 34.86 kg/m.     Physical Exam  Constitutional: He is oriented to person, place, and time. Vital signs are normal. He appears well-developed and well-nourished.  HENT:  Head: Normocephalic and atraumatic.  Neck: Normal range of motion.  Cardiovascular: Normal rate, regular rhythm, normal heart sounds and intact distal pulses. Frequent extrasystoles are present.  Pulmonary/Chest: Effort normal and breath sounds normal. No accessory muscle usage. No respiratory distress.  Abdominal: Soft. Bowel sounds are normal.  Musculoskeletal: Normal range of motion.  Neurological: He is alert and oriented to person, place, and time.  Skin: Skin is warm and dry.  Vitals reviewed.  Radiology: No results found.  Laboratory examination:    CMP Latest Ref Rng & Units 08/02/2018 08/17/2014 03/14/2009  Glucose 70 - 99 mg/dL 811(B137(H) 147(W131(H) 295(A103(H)  BUN 6 - 20 mg/dL 13 12 10   Creatinine 0.61 - 1.24 mg/dL 2.130.89 0.860.80 5.780.79  Sodium 135 - 145 mmol/L 135 139 138  Potassium 3.5 - 5.1 mmol/L 4.4 4.1 3.7   Chloride 98 - 111 mmol/L 103 103 104  CO2 22 - 32 mmol/L 25 28 28   Calcium 8.9 - 10.3 mg/dL 9.0 9.0 4.6(N8.3(L)   CBC Latest Ref Rng & Units 08/02/2018 08/17/2014 03/14/2009  WBC 4.0 - 10.5 K/uL 8.0 11.4(H) 7.8  Hemoglobin 13.0 - 17.0 g/dL 62.914.8 52.814.3 41.311.4 DELTA CHECK NOTED RESULT REPEATED AND VERIFIED(L)  Hematocrit 39.0 - 52.0 % 46.0 43.5 33.8(L)  Platelets 150 - 400 K/uL 202 137(L) 194 DELTA CHECK NOTED SPECIMEN CHECKED FOR CLOTS   Lipid Panel  No results found for: CHOL, TRIG, HDL, CHOLHDL, VLDL, LDLCALC, LDLDIRECT HEMOGLOBIN A1C No results found for: HGBA1C, MPG TSH No results for input(s): TSH in the last 8760 hours.  PRN Meds:. Medications Discontinued During This Encounter  Medication Reason  . ondansetron (ZOFRAN) 4 MG tablet No longer needed (for PRN medications)   Current Meds  Medication Sig  . acetaminophen (TYLENOL) 500 MG  tablet Take 2 tablets (1,000 mg total) by mouth every 8 (eight) hours for 14 days.  . celecoxib (CELEBREX) 100 MG capsule Take 1 capsule (100 mg total) by mouth 2 (two) times daily.  . metoprolol succinate (TOPROL-XL) 25 MG 24 hr tablet Take 1 tablet (25 mg total) by mouth daily. Take with or immediately following a meal. (Patient taking differently: Take 25 mg by mouth at bedtime. Take with or immediately following a meal.)    Cardiac Studies:   Echo 07/06/2018:  1. The left ventricle has normal systolic function, with an ejection fraction of 55-60%. There is mild concentric left ventricular hypertrophy. Left ventricular diastolic parameters were normal. No evidence of left ventricular regional wall motion  abnormalities.  2. The right ventricle has normal systolic function. The cavity was normal. There is no increase in right ventricular wall thickness. Right ventricular systolic pressure could not be assessed.  3. Left atrial size was severely dilated.  4. Right atrial size was mildly dilated.  Lexiscan Sestamibi Stress Test 07/09/2018: Resting EKG  demonstrates normal sinus rhythm, frequent unifocal PVCs in the pattern of bigeminy.Nondiagnostic ECG stress, no change with Lexiscan infusion.  The perfusion imaging study demonstrates soft tissue attenuation artifact in the inferior wall. There is no demonstrable ischemia or scar. LV systolic function was normal at 52% without wall motion abnormality. This is a low risk study.  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 Moderate obesity  E66.8   3. Essential hypertension  I10     EKG 08/09/2018: Sinus rhythm at 91 bpm with PVC's in trigeminal pattern, left atrial enlargement, no evidence of ischemia.   Recommendations:   Patient has had improvement in PVCs with addition of metoprolol, but continues to have them in trigeminal pattern on EKG today.  He is monitoring his pulse manually.  I will increase metoprolol to 50 mg daily.  Blood pressure is also borderline elevated, will hopefully improved with higher dose of metoprolol.  He has been working to lose weight, and has lost approximately 15 pounds since originally seen by us.  I congratulated him on this and provided positive reinforcement.  He is hoping to start exercise once cleared from orthopedics.  I do continue to feel that he needs evaluation from sleep medicine for possible sleep study due to snoring, frequent PVCs, and left atrial dilation on echocardiogram.  We will see him back in 8 weeks to follow-up on PVCs.    Chad FailAshton Haynes Kristopher Attwood, MSN, APRN, FNP-C [facility] [place] Office: [phone] Fax: [phone]

## 2018-10-04 ENCOUNTER — Ambulatory Visit: Payer: 59 | Admitting: Cardiology

## 2018-10-04 NOTE — Progress Notes (Deleted)
Primary Physician:  Gaspar Garbeisovec, Richard W, MD   Patient ID: Chad DonKenneth R Altic, male    DOB: Nov 12, 1965, 53 y.o.   MRN: [id number]  Subjective:    No chief complaint on file.    HPI: Chad Graves  is a 53 y.o. male  with obesity, history of substance abuse, frequent PVC's, recently evaluated by us for perioperative cardiac risk stratification.  Due to frequent PVC's, patient underwent echocardiogram on 07/06/18 that revealed normal LVEF, severely left atrium dilation. Lexiscan myoview stress test on 07/09/2018 was considered low risk study. Due to continued frequent PVC's, metoprolol succinate was increased to 50 mg daily. He now presents for follow up.   Patient recently underwent surgery for left shoulder dislocation and is recovering well. He has not yet started PT. I had referred patient for possible sleep study; however, has been on hold in view of his shoulder surgery.  He is asymptomatic in regard to his PVC's. Has noticed some improvement with manually checking his pulse since being on Metoprolol. He denies any dyspnea on exertion, palpitations, dizziness, syncope, leg swelling, no symptoms suggestive of claudication or TIA.  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 Does have periods where he does not get enough sleep and works for 20+ hours straight.  Denies any former or current tobacco or alcohol use. He does use small amount of marijuana every other day. Previously used coccaine for about 4 years in his 8120's. He has been abstinent since that time.  Patient reports that his son as SVT and his age 55. Father had aneurysm rupture in his 6570's he also had CKD. Paternal grandfather died from MI in his 970's as well.   Past Medical History:  Diagnosis Date  . Articular cartilage disorder involving shoulder region, left   . PVC (premature ventricular contraction)   . Snores     Past Surgical History:  Procedure Laterality Date  . NOSE SURGERY    . REPLACEMENT TOTAL  KNEE Left   . SHOULDER ARTHROSCOPY WITH SUBACROMIAL DECOMPRESSION, ROTATOR CUFF REPAIR AND BICEP TENDON REPAIR Left 08/02/2018   Procedure: LEFT SHOULDER ARTHROSCOPY DEBRIDEMENT,SUBACROMIAL DECOMPRESSION,DISTAL CALVICAL EXCISION, ROTATOR CURR REPAIR, BICEP TENODESIS, BANKART REPAIR;  Surgeon: Bjorn PippinVarkey, Dax T, MD;  Location: [facility];  Service: Orthopedics;  Laterality: Left;    Social History   Socioeconomic History  . Marital status: Married    Spouse name: Not on file  . Number of children: 1  . Years of education: Not on file  . Highest education level: Not on file  Occupational History  . Not on file  Social Needs  . Financial resource strain: Not on file  . Food insecurity    Worry: Not on file    Inability: Not on file  . Transportation needs    Medical: Not on file    Non-medical: Not on file  Tobacco Use  . Smoking status: Never Smoker  . Smokeless tobacco: Never Used  Substance and Sexual Activity  . Alcohol use: No  . Drug use: Yes    Types: Marijuana    Comment: last smoked 07-21-18  . Sexual activity: Not on file  Lifestyle  . Physical activity    Days per week: Not on file    Minutes per session: Not on file  . Stress: Not on file  Relationships  . Social Musicianconnections    Talks on phone: Not on file    Gets together: Not on file    Attends religious  service: Not on file    Active member of club or organization: Not on file    Attends meetings of clubs or organizations: Not on file    Relationship status: Not on file  . Intimate partner violence    Fear of current or ex partner: Not on file    Emotionally abused: Not on file    Physically abused: Not on file    Forced sexual activity: Not on file  Other Topics Concern  . Not on file  Social History Narrative  . Not on file    Review of Systems  Constitution: Positive for weight gain. Negative for decreased appetite, malaise/fatigue and weight loss.  Eyes: Negative for visual disturbance.  Cardiovascular:  Negative for chest pain, claudication, dyspnea on exertion, leg swelling, orthopnea, palpitations and syncope.  Respiratory: Positive for snoring. Negative for hemoptysis and wheezing.   Endocrine: Negative for cold intolerance and heat intolerance.  Hematologic/Lymphatic: Does not bruise/bleed easily.  Skin: Negative for nail changes.  Musculoskeletal: Positive for joint pain. Negative for muscle weakness and myalgias.  Gastrointestinal: Negative for abdominal pain, change in bowel habit, nausea and vomiting.  Neurological: Negative for difficulty with concentration, dizziness, focal weakness and headaches.  Psychiatric/Behavioral: Negative for altered mental status and suicidal ideas.  All other systems reviewed and are negative.     Objective:  There were no vitals taken for this visit. There is no height or weight on file to calculate BMI.     Physical Exam  Constitutional: He is oriented to person, place, and time. Vital signs are normal. He appears well-developed and well-nourished.  HENT:  Head: Normocephalic and atraumatic.  Neck: Normal range of motion.  Cardiovascular: Normal rate, regular rhythm, normal heart sounds and intact distal pulses. Frequent extrasystoles are present.  Pulmonary/Chest: Effort normal and breath sounds normal. No accessory muscle usage. No respiratory distress.  Abdominal: Soft. Bowel sounds are normal.  Musculoskeletal: Normal range of motion.  Neurological: He is alert and oriented to person, place, and time.  Skin: Skin is warm and dry.  Vitals reviewed.  Radiology: No results found.  Laboratory examination:    CMP Latest Ref Rng & Units 08/02/2018 08/17/2014 03/14/2009  Glucose 70 - 99 mg/dL 478(G137(H) 956(O131(H) 130(Q103(H)  BUN 6 - 20 mg/dL 13 12 10   Creatinine 0.61 - 1.24 mg/dL 6.570.89 8.460.80 9.620.79  Sodium 135 - 145 mmol/L 135 139 138  Potassium 3.5 - 5.1 mmol/L 4.4 4.1 3.7  Chloride 98 - 111 mmol/L 103 103 104  CO2 22 - 32 mmol/L 25 28 28   Calcium 8.9 -  10.3 mg/dL 9.0 9.0 9.5(M8.3(L)   CBC Latest Ref Rng & Units 08/02/2018 08/17/2014 03/14/2009  WBC 4.0 - 10.5 K/uL 8.0 11.4(H) 7.8  Hemoglobin 13.0 - 17.0 g/dL 84.114.8 32.414.3 40.111.4 DELTA CHECK NOTED RESULT REPEATED AND VERIFIED(L)  Hematocrit 39.0 - 52.0 % 46.0 43.5 33.8(L)  Platelets 150 - 400 K/uL 202 137(L) 194 DELTA CHECK NOTED SPECIMEN CHECKED FOR CLOTS   Lipid Panel  No results found for: CHOL, TRIG, HDL, CHOLHDL, VLDL, LDLCALC, LDLDIRECT HEMOGLOBIN A1C No results found for: HGBA1C, MPG TSH No results for input(s): TSH in the last 8760 hours.  PRN Meds:. There are no discontinued medications. No outpatient medications have been marked as taking for the 10/04/18 encounter (Appointment) with Toniann FailKelley, Ashton Haynes, NP.    Cardiac Studies:   Echo 07/06/2018:  1. The left ventricle has normal systolic function, with an ejection fraction of 55-60%. There is mild concentric left  ventricular hypertrophy. Left ventricular diastolic parameters were normal. No evidence of left ventricular regional wall motion  abnormalities.  2. The right ventricle has normal systolic function. The cavity was normal. There is no increase in right ventricular wall thickness. Right ventricular systolic pressure could not be assessed.  3. Left atrial size was severely dilated.  4. Right atrial size was mildly dilated.  Lexiscan Sestamibi Stress Test 07/09/2018: Resting EKG demonstrates normal sinus rhythm, frequent unifocal PVCs in the pattern of bigeminy.Nondiagnostic ECG stress, no change with Lexiscan infusion.  The perfusion imaging study demonstrates soft tissue attenuation artifact in the inferior wall. There is no demonstrable ischemia or scar. LV systolic function was normal at 52% without wall motion abnormality. This is a low risk study.  Assessment:   No diagnosis found.  EKG 08/09/2018: Sinus rhythm at 91 bpm with PVC's in trigeminal pattern, left atrial enlargement, no evidence of ischemia.    Recommendations:   Patient has had improvement in PVCs with addition of metoprolol, but continues to have them in trigeminal pattern on EKG today.  He is monitoring his pulse manually.  I will increase metoprolol to 50 mg daily.  Blood pressure is also borderline elevated, will hopefully improved with higher dose of metoprolol.  He has been working to lose weight, and has lost approximately 15 pounds since originally seen by [place].  I congratulated him on this and provided positive reinforcement.  He is hoping to start exercise once cleared from orthopedics.  I do continue to feel that he needs evaluation from sleep medicine for possible sleep study due to snoring, frequent PVCs, and left atrial dilation on echocardiogram.  We will see him back in 8 weeks to follow-up on PVCs.    Miquel Dunn, MSN, APRN, FNP-C [facility] [place] Office: [phone] Fax: [phone]

## 2018-11-12 ENCOUNTER — Other Ambulatory Visit: Payer: Self-pay

## 2018-11-12 MED ORDER — METOPROLOL SUCCINATE ER 50 MG PO TB24: 50.0000 mg | ORAL_TABLET | Freq: Every day | ORAL | 2 refills | Status: DC

## 2018-11-19 ENCOUNTER — Telehealth: Payer: Self-pay

## 2018-11-19 ENCOUNTER — Other Ambulatory Visit: Payer: Self-pay

## 2018-11-19 MED ORDER — METOPROLOL SUCCINATE ER 50 MG PO TB24: 50.0000 mg | ORAL_TABLET | Freq: Every day | ORAL | 0 refills | Status: DC

## 2018-11-19 NOTE — Telephone Encounter (Signed)
Patient wife call asking for refill on Metoprolol. I advised her patient needs to schedule an appointment for refills. Patient wife said she will call back on Monday to schedule appointment for her husband. Metoprolol rx sent to pharmacy.

## 2019-04-01 ENCOUNTER — Encounter: Payer: Self-pay | Admitting: Cardiology

## 2019-04-01 ENCOUNTER — Other Ambulatory Visit: Payer: Self-pay

## 2019-04-01 ENCOUNTER — Ambulatory Visit: Payer: 59 | Admitting: Cardiology

## 2019-04-01 VITALS — BP 165/84 | HR 60 | Temp 98.1°F | Ht 72.0 in | Wt 286.6 lb

## 2019-04-01 DIAGNOSIS — R0683 Snoring: Secondary | ICD-10-CM

## 2019-04-01 DIAGNOSIS — I10 Essential (primary) hypertension: Secondary | ICD-10-CM

## 2019-04-01 DIAGNOSIS — E668 Other obesity: Secondary | ICD-10-CM

## 2019-04-01 DIAGNOSIS — I493 Ventricular premature depolarization: Secondary | ICD-10-CM

## 2019-04-01 MED ORDER — METOPROLOL SUCCINATE ER 50 MG PO TB24: 50.0000 mg | ORAL_TABLET | Freq: Every day | ORAL | 0 refills | Status: DC

## 2019-04-01 MED ORDER — LOSARTAN POTASSIUM-HCTZ 50-12.5 MG PO TABS: 1.0000 | ORAL_TABLET | Freq: Every day | ORAL | 1 refills | Status: DC

## 2019-04-01 NOTE — Progress Notes (Signed)
Primary Physician:  Haywood Pao, MD   Patient ID: Chad Graves, male    DOB: [birth year], 54 y.o.   MRN: [id number]  Subjective:    Chief Complaint  Patient presents with  . Palpitations  . Hypertension  . Follow-up    8 week, c/o weight gain     HPI: Chad Graves  is a 54 y.o. male  with obesity, history of substance abuse, frequent PVC's, Underwent echocardiogram on 07/06/18 that revealed normal LVEF, severely left atrium dilation. Lexiscan myoview stress test on 07/09/2018 was considered low risk study. He has had improvement in PVC's with Metoprolol, but due to continued frequent PVC's metoprolol was increased to 50 mg at his last office visit in June 2020. He was originally scheduled for follow up in August, but was lost to follow up.  Patient recently underwent surgery for left shoulder dislocation and is recovering well. He has not yet started PT. I had referred patient for possible sleep study; however, he did not follow through.   He denies any dyspnea on exertion, palpitations, dizziness, syncope, leg swelling, no symptoms suggestive of claudication or TIA. He is concerned about weight gain. Requesting to be able to go back to the gym.  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 Does have periods where he does not get enough sleep and works for 20+ hours straight.  Denies any former or current tobacco or alcohol use. He does use small amount of marijuana every other day. Previously used coccaine for about 4 years in his 65's. He has been abstinent since that time.  Patient reports that his son as SVT and his age 23. Father had aneurysm rupture in his 66's he also had CKD. Paternal grandfather died from MI in his 62's as well.   Past Medical History:  Diagnosis Date  . Articular cartilage disorder involving shoulder region, left   . PVC (premature ventricular contraction)   . Snores     Past Surgical History:  Procedure Laterality Date  . NOSE  SURGERY    . REPLACEMENT TOTAL KNEE Left   . SHOULDER ARTHROSCOPY WITH SUBACROMIAL DECOMPRESSION, ROTATOR CUFF REPAIR AND BICEP TENDON REPAIR Left 08/02/2018   Procedure: LEFT SHOULDER ARTHROSCOPY DEBRIDEMENT,SUBACROMIAL DECOMPRESSION,DISTAL CALVICAL EXCISION, ROTATOR CURR REPAIR, BICEP TENODESIS, BANKART REPAIR;  Surgeon: Hiram Gash, MD;  Location: [facility];  Service: Orthopedics;  Laterality: Left;    Social History   Socioeconomic History  . Marital status: Married    Spouse name: Not on file  . Number of children: 1  . Years of education: Not on file  . Highest education level: Not on file  Occupational History  . Not on file  Tobacco Use  . Smoking status: Never Smoker  . Smokeless tobacco: Never Used  Substance and Sexual Activity  . Alcohol use: No  . Drug use: Yes    Types: Marijuana    Comment: last smoked 07-21-18  . Sexual activity: Not on file  Other Topics Concern  . Not on file  Social History Narrative  . Not on file   Social Determinants of Health   Financial Resource Strain:   . Difficulty of Paying Living Expenses: Not on file  Food Insecurity:   . Worried About Charity fundraiser in the Last Year: Not on file  . Ran Out of Food in the Last Year: Not on file  Transportation Needs:   . Lack of Transportation (Medical): Not on file  .  Lack of Transportation (Non-Medical): Not on file  Physical Activity:   . Days of Exercise per Week: Not on file  . Minutes of Exercise per Session: Not on file  Stress:   . Feeling of Stress : Not on file  Social Connections:   . Frequency of Communication with Friends and Family: Not on file  . Frequency of Social Gatherings with Friends and Family: Not on file  . Attends Religious Services: Not on file  . Active Member of Clubs or Organizations: Not on file  . Attends Banker Meetings: Not on file  . Marital Status: Not on file  Intimate Partner Violence:   . Fear of Current or Ex-Partner: Not on file    . Emotionally Abused: Not on file  . Physically Abused: Not on file  . Sexually Abused: Not on file    Review of Systems  Constitution: Positive for weight gain. Negative for decreased appetite, malaise/fatigue and weight loss.  Eyes: Negative for visual disturbance.  Cardiovascular: Negative for chest pain, claudication, dyspnea on exertion, leg swelling, orthopnea, palpitations and syncope.  Respiratory: Positive for snoring. Negative for hemoptysis and wheezing.   Endocrine: Negative for cold intolerance and heat intolerance.  Hematologic/Lymphatic: Does not bruise/bleed easily.  Skin: Negative for nail changes.  Musculoskeletal: Positive for joint pain. Negative for muscle weakness and myalgias.  Gastrointestinal: Negative for abdominal pain, change in bowel habit, nausea and vomiting.  Neurological: Negative for difficulty with concentration, dizziness, focal weakness and headaches.  Psychiatric/Behavioral: Negative for altered mental status and suicidal ideas.  All other systems reviewed and are negative.     Objective:  Blood pressure (!) 165/84, pulse 60, temperature 98.1 F (36.7 C), height 6' (1.829 m), weight 286 lb 9.6 oz (130 kg), SpO2 97 %. Body mass index is 38.87 kg/m.     Physical Exam  Constitutional: He is oriented to person, place, and time. Vital signs are normal. He appears well-developed and well-nourished.  HENT:  Head: Normocephalic and atraumatic.  Cardiovascular: Normal rate, regular rhythm, normal heart sounds and intact distal pulses. Frequent extrasystoles are present.  Pulmonary/Chest: Effort normal and breath sounds normal. No accessory muscle usage. No respiratory distress.  Abdominal: Soft. Bowel sounds are normal.  Musculoskeletal:        General: Normal range of motion.     Cervical back: Normal range of motion.  Neurological: He is alert and oriented to person, place, and time.  Skin: Skin is warm and dry.  Vitals  reviewed.  Radiology: No results found.  Laboratory examination:    CMP Latest Ref Rng & Units 08/02/2018 08/17/2014 03/14/2009  Glucose 70 - 99 mg/dL 161(W) 960(A) 540(J)  BUN 6 - 20 mg/dL 13 12 10   Creatinine 0.61 - 1.24 mg/dL 8.11 9.14  Sodium 135 - 145 mmol/L 135 139 138  Potassium 3.5 - 5.1 mmol/L 4.4 4.1 3.7  Chloride 98 - 111 mmol/L 103 103 104  CO2 22 - 32 mmol/L 25 28 28   Calcium 8.9 - 10.3 mg/dL 9.0 9.0 7.82)   CBC Latest Ref Rng & Units 08/02/2018 08/17/2014 03/14/2009  WBC 4.0 - 10.5 K/uL 8.0 11.4(H) 7.8  Hemoglobin 13.0 - 17.0 g/dL 10/18/2014 05/12/2009 21.3 DELTA CHECK NOTED RESULT REPEATED AND VERIFIED(L)  Hematocrit 39.0 - 52.0 % 46.0 43.5 33.8(L)  Platelets 150 - 400 K/uL 202 137(L) 194 DELTA CHECK NOTED SPECIMEN CHECKED FOR CLOTS   Lipid Panel  No results found for: CHOL, TRIG, HDL, CHOLHDL, VLDL, LDLCALC, LDLDIRECT HEMOGLOBIN  A1C No results found for: HGBA1C, MPG TSH No results for input(s): TSH in the last 8760 hours.  PRN Meds:. Medications Discontinued During This Encounter  Medication Reason  . celecoxib (CELEBREX) 100 MG capsule Error   No outpatient medications have been marked as taking for the 04/01/19 encounter (Office Visit) with Toniann Fail, NP.    Cardiac Studies:   Echo 07/06/2018:  1. The left ventricle has normal systolic function, with an ejection fraction of 55-60%. There is mild concentric left ventricular hypertrophy. Left ventricular diastolic parameters were normal. No evidence of left ventricular regional wall motion  abnormalities.  2. The right ventricle has normal systolic function. The cavity was normal. There is no increase in right ventricular wall thickness. Right ventricular systolic pressure could not be assessed.  3. Left atrial size was severely dilated.  4. Right atrial size was mildly dilated.  Lexiscan Sestamibi Stress Test 07/09/2018: Resting EKG demonstrates normal sinus rhythm, frequent unifocal PVCs in the pattern of  bigeminy.Nondiagnostic ECG stress, no change with Lexiscan infusion.  The perfusion imaging study demonstrates soft tissue attenuation artifact in the inferior wall. There is no demonstrable ischemia or scar. LV systolic function was normal at 52% without wall motion abnormality. This is a low risk study.  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 Essential hypertension  I10 EKG 12-Lead  3. Moderate obesity  E66.8     EKG 04/01/2019: Normal sinus rhythm at 60 bpm with frequent PVCs in bigeminal pattern, normal axis, no evidence of ischemia.   Recommendations:   Patient is doing well without any complaints today.  He is concerned about his weight gain and is wanting to resume going back to the gym.  I have encouraged him that he may start some light exercises, but to slowly ease back into exercising.  He does continue to have asymptomatic frequent PVCs.  I suspect that this is being contributed by his weight gain, uncontrolled hypertension, and potentially underlying sleep apnea.  He did not follow through with his referral previously, I discussed the importance of evaluation of sleep apnea, and he is now willing.  I will replace referral for this.  I will start losartan hydrochlorothiazide 50--12.5 mg daily.  Will check BMP in 2 weeks for surveillance of his kidney function.  Hopefully with better blood pressure control, PVCs will improve.  Continue with metoprolol for now.  Extensive discussion with the patient regarding the importance of dietary changes for weight loss.  Will provide [facility] diet sheet for weight loss.  I will see him back in 4 weeks for follow-up for hypertension and PVCs.    Toniann Fail, MSN, APRN, FNP-C [facility] Office: [phone] Fax: [phone]

## 2019-04-01 NOTE — Patient Instructions (Signed)
Work on [employer] loss  Start Losartan HCTZ daily. Continue with Metoprolol.  Labs in 2 weeks to check kidney function  Evaluation for sleep apnea

## 2019-04-29 ENCOUNTER — Ambulatory Visit: Payer: 59 | Admitting: Cardiology

## 2019-06-06 ENCOUNTER — Other Ambulatory Visit: Payer: Self-pay

## 2019-06-06 MED ORDER — LOSARTAN POTASSIUM-HCTZ 50-12.5 MG PO TABS: 1.0000 | ORAL_TABLET | Freq: Every day | ORAL | 1 refills | Status: DC

## 2019-07-08 ENCOUNTER — Ambulatory Visit: Payer: 59 | Admitting: Cardiology

## 2019-07-14 ENCOUNTER — Ambulatory Visit: Payer: 59 | Admitting: Cardiology

## 2022-07-14 ENCOUNTER — Ambulatory Visit (INDEPENDENT_AMBULATORY_CARE_PROVIDER_SITE_OTHER): Payer: No Typology Code available for payment source | Admitting: Internal Medicine

## 2022-07-14 ENCOUNTER — Encounter: Payer: Self-pay | Admitting: Internal Medicine

## 2022-07-14 VITALS — BP 166/95 | HR 65 | Temp 98.3°F | Ht 72.0 in | Wt 232.4 lb

## 2022-07-14 DIAGNOSIS — E663 Overweight: Secondary | ICD-10-CM | POA: Diagnosis not present

## 2022-07-14 DIAGNOSIS — Z1211 Encounter for screening for malignant neoplasm of colon: Secondary | ICD-10-CM

## 2022-07-14 DIAGNOSIS — F1421 Cocaine dependence, in remission: Secondary | ICD-10-CM

## 2022-07-14 DIAGNOSIS — I10 Essential (primary) hypertension: Secondary | ICD-10-CM | POA: Diagnosis not present

## 2022-07-14 DIAGNOSIS — I499 Cardiac arrhythmia, unspecified: Secondary | ICD-10-CM | POA: Diagnosis not present

## 2022-07-14 DIAGNOSIS — Z8249 Family history of ischemic heart disease and other diseases of the circulatory system: Secondary | ICD-10-CM

## 2022-07-14 HISTORY — DX: Cardiac arrhythmia, unspecified: I49.9

## 2022-07-14 HISTORY — DX: Cocaine dependence, in remission: F14.21

## 2022-07-14 MED ORDER — LOSARTAN POTASSIUM-HCTZ 50-12.5 MG PO TABS
1.0000 | ORAL_TABLET | Freq: Every day | ORAL | 3 refills | Status: DC
Start: 2022-07-14 — End: 2022-08-13

## 2022-07-14 NOTE — Patient Instructions (Signed)
Welcome aboard!   Today's visit was a valuable first step in understanding your health and starting your personalized care journey. We discussed your medical history and medications in detail. Given the extensive information, we prioritized addressing your most pressing concerns.  We understood those concerns to be:  New Patient (Initial Visit) and Hypertension   Building a Complete Picture  To create the most effective care plan possible, we may need additional information from previous providers. We encouraged you to gather any relevant medical records for your next visit. This will help [place] build a more complete picture and develop a personalized plan together. In the meantime, we'll address your immediate concerns and provide resources to help you manage all of your medical issues.  We encourage you to use MyChart to review these efforts, and to help [place] find and correct any omissions or errors in your medical chart.  Managing Your Health Over Time  Managing every aspect of your health in a single visit isn't always feasible, but that's okay.  We addressed your most pressing concerns today and charted a course for future care. Acute conditions or preventive care measures may require further attention.  We encourage you to schedule a follow-up visit at your earliest convenience to discuss any unresolved issues.  We strongly encourage participation in annual preventive care visits to help [place] develop a more thorough understanding of your health and to help you maintain optimal wellness - please inquire about scheduling your next one with [place] at your earliest convenience.  Your Satisfaction Matters  It was a pleasure seeing you today!  Your health and satisfaction will always be my top priorities. If you believe your experience today was worthy of a 5-star rating, I'd be grateful for your feedback!  [name], MD  Summary: most important thing is to start monitoring blood pressure at home and  taking medications to keep under 140/90.   We will follow up to make sure that's working and do annual exam with labwork in 1 month. Keep doing great job on weight loss.  Next Steps  Schedule Follow-Up:  We recommend a follow-up appointment in Return in about 1 month (around 08/13/2022) for annual preventive care visit. If your condition worsens before then, please call us or seek emergency care. Preventive Care:  Don't forget to schedule your annual preventive care visit!  This important checkup is typically covered by insurance and helps identify potential health issues early.  Typically its 100% insurance covered with no co-pay and helps to get surveillance labwork paid for through your insurance provider.  Sometimes it even lowers your insurance premiums to participate. Medical Information Release:  For any relevant medical information we don't have, please sign a release form so we can obtain it for your records. Lab & X-ray Appointments:  Scheduled any incomplete lab tests today or call us to schedule.  X-Rays can be done without an appointment at [facility] ([address]), M-F 8:30am-noon or 1pm-5pm.  Just tell them you're there for X-rays ordered by Dr. Jon Billings.  We'll receive the results and contact you by phone or MyChart to discuss next steps.  Bring to Your Next Appointment  Medications: Please bring all your medication bottles to your next appointment to ensure we have an accurate record of your prescriptions. Health Diaries: If you're monitoring any health conditions at home, keeping a diary of your readings can be very helpful for discussions at your next appointment.  Reviewing Your  Records  Please Review this early draft of your clinical notes below and the final encounter summary tomorrow on MyChart after its been completed.   Colon cancer screening -     Cologuard; Future  Hypertension, unspecified type Assessment & Plan: Encouraged patient to to  self monitor Reviewed available data from patient and  BP Readings from Last 3 Encounters:  07/14/22 (!) 166/95  04/01/19 (!) 165/84  08/09/18 140/79   My individualized, goal average blood pressure for this patient, after considering the evidence for and against aggressive blood pressure goals as well as their past medical history and preferences, is 140/90 In my medical opinion, this problem is stable, inadequately controlled  Increased medication(s) prescribed after collaborative review. Following informed consent, we adjusted the medication regimen as per documented orders to now be:  Current hypertension medications:     No hypertension medications on med list     We explained the expected benefits and potential side effects of the new medications and encouraged the patient to report any concerns. Information for patient review: Please limit and avoid: salt, alcohol, NSAIDS, excess body weight, smoking, stress, sedentary lifestyles The risks of poor control over time are FUTURE stroke and heart attacks, but if you have a blood pressure over 180 and any red flag symptoms(headache/shortness of breath/confusion/chest discomfort) you should go to the ER.  You are encouraged to do home blood pressure monitoring, at least as many times per week as blood pressure medications you are on.  For example, bring a diary with 3 home blood pressure readings per week to each visit if you are on 3 blood pressure medications.   If you are on more than 3 medication(s) or have certain risk factors, we should do a resistant hypertension workup See AFTER VISIT SUMMARY for addition educational information provided Please let me know in advance when you need medication(s) refills, consistently taking your medication is very important!   Orders: -     Losartan Potassium-HCTZ; Take 1 tablet by mouth daily.  Dispense: 90 tablet; Refill: 3  Overweight Assessment & Plan: Patient reports he will do weight loss  on own, not interested  Orders: -     Cologuard; Future -     CBC with Differential/Platelet; Future -     Comprehensive metabolic panel; Future -     TSH; Future -     Lipid panel; Future -     Hemoglobin A1c; Future  Family history of coronary arteriosclerosis  Cardiac arrhythmia, unspecified cardiac arrhythmia type  History of cocaine dependence (HCC)     Getting Answers and Following Up  Simple Questions & Concerns: For quick questions or basic follow-up after your visit, reach [place] at [phone] or MyChart messaging. Complex Concerns: If your concern is more complex, scheduling an appointment might be best. Discuss this with the staff to find the most suitable option. Lab & Imaging Results: We'll contact you directly if results are abnormal or you don't use MyChart. Most normal results will be on MyChart within 2-3 business days, with a review message from Dr. Jon Billings. Haven't heard back in 2 weeks? Need results sooner? Contact us at [phone]. Referrals: Our referral coordinator will manage specialist referrals. The specialist's office should contact you within 2 weeks to schedule an appointment. Call us if you haven't heard from them after 2 weeks.  Staying Connected  MyChart: Activate your MyChart for the fastest way to access results and message [place]. See the last page of this paperwork  for instructions.  Billing  X-ray & Lab Orders: These are billed by separate companies. Contact the invoicing company directly for questions or concerns. Visit Charges: Discuss any billing inquiries with our administrative services team.  Feedback & Satisfaction  Share Your Experience: We strive for your satisfaction! If you have any complaints, please let Dr. Jon Billings know directly or contact our Practice Administrators, Edwena Felty or [employer], by asking at the front desk.  Scheduling Tips  Shorter Wait Times: 8 am and 1 pm appointments often have the quickest wait  times. Longer Appointments: If you need more time during your visit, talk to the front desk. Due to insurance regulations, multiple back-to-back appointments might be necessary.

## 2022-07-14 NOTE — Assessment & Plan Note (Signed)
Patient reports he will do weight loss on own, not interested

## 2022-07-14 NOTE — Progress Notes (Signed)
[facility]  Phone: [phone]  New patient visit  Visit Date: 07/14/2022 Patient: Chad Graves   DOB: 11-27-1965   57 y.o. Male  MRN: [id number] PCP:  [name], MD  (establishing care today)  Today's Health Care Provider: [name], MD   Assessment and Plan:    We considered annual preventive exam with new patient visit. However, I preferred to get him started on blood pressure medications then return to get annual exam bloodwork after initiating blood pressure medications and recheck blood pressure in 2-4 weeks.  Keene was seen today for new patient (initial visit) and hypertension.  Colon cancer screening -     Cologuard; Future  Hypertension, unspecified type Overview: Interim history from July 14, 2022:    Supposed to take medication(s) but hasn't been Home readings: has been elevated last few months. Patient reports taking current medications consistently and not experiencing any significant associated side effects or symptoms. Current hypertension medications:     No hypertension medications on med list      Lab Results  Component Value Date   NA 135 08/02/2018   K 4.4 08/02/2018   CREATININE 0.89 08/02/2018     Assessment & Plan: Encouraged patient to to self monitor Reviewed available data from patient and  BP Readings from Last 3 Encounters:  07/14/22 (!) 166/95  04/01/19 (!) 165/84  08/09/18 140/79   My individualized, goal average blood pressure for this patient, after considering the evidence for and against aggressive blood pressure goals as well as their past medical history and preferences, is 140/90 In my medical opinion, this problem is stable, inadequately controlled  Increased medication(s) prescribed after collaborative review. Following informed consent, we adjusted the medication regimen as per documented orders to now be:  Current hypertension medications:     No hypertension medications on med list      We explained the expected benefits and potential side effects of the new medications and encouraged the patient to report any concerns. Information for patient review: Please limit and avoid: salt, alcohol, NSAIDS, excess body weight, smoking, stress, sedentary lifestyles The risks of poor control over time are FUTURE stroke and heart attacks, but if you have a blood pressure over 180 and any red flag symptoms(headache/shortness of breath/confusion/chest discomfort) you should go to the ER.  You are encouraged to do home blood pressure monitoring, at least as many times per week as blood pressure medications you are on.  For example, bring a diary with 3 home blood pressure readings per week to each visit if you are on 3 blood pressure medications.   If you are on more than 3 medication(s) or have certain risk factors, we should do a resistant hypertension workup See AFTER VISIT SUMMARY for addition educational information provided Please let me know in advance when you need medication(s) refills, consistently taking your medication is very important!   Orders: -     Losartan Potassium-HCTZ; Take 1 tablet by mouth daily.  Dispense: 90 tablet; Refill: 3  Overweight Overview: Increased body mass noted.  Body mass index is 31.52 kg/m.  Well proportioned with no abnormal fat distribution.  Good muscle tone.  No results found for: "TSH" No results found for: "HGBA1C"  Wt Readings from Last 10 Encounters:  07/14/22 232 lb 6.4 oz (105.4 kg)  04/01/19 286 lb 9.6 oz (130 kg)  08/09/18 257 lb (116.6 kg)  08/02/18 265 lb (120.2 kg)  07/15/18 265 lb (120.2 kg)  07/02/18 270 lb 3.2 oz (122.6 kg)  08/17/14 225 lb (102.1 kg)     Assessment & Plan: Patient reports he will do weight loss on own, not interested  Orders: -     Cologuard; Future -     CBC with Differential/Platelet; Future -     Comprehensive metabolic panel; Future -     TSH; Future -     Lipid panel; Future -      Hemoglobin A1c; Future  Family history of coronary arteriosclerosis  Cardiac arrhythmia, unspecified cardiac arrhythmia type Overview: Remote history of heart slow, double beats, was on metoprolol, now beating regular    History of cocaine dependence (HCC) Overview: In 30s had difficulty with this and just wanted [place] to know but no use in over 30 years.          Clinical Presentation:    Patient affirms intent to establish a primary care provider relationship with [name], MD going forward.  57 y.o. male  with main concerns (chief complaints) today expressed as New Patient (Initial Visit) and Hypertension  Comprehensive chart development today: Problems:has OSTEOARTHRITIS, KNEE; BAKER'S CYST, LEFT KNEE; Hypertension; and Overweight on their problem list. Current Meds  Medication Sig   [DISCONTINUED] losartan-hydrochlorothiazide (HYZAAR) 50-12.5 MG tablet Take 1 tablet by mouth daily.   [DISCONTINUED] metoprolol succinate (TOPROL-XL) 50 MG 24 hr tablet Take 1 tablet (50 mg total) by mouth daily. Take with or immediately following a meal. Please schedule f/u appointment.   Allergies:  reports no history of alcohol use. Past Medical History  has a past medical history of Abnormal heart rhythm (07/14/2022), Articular cartilage disorder involving shoulder region, left, History of cocaine dependence (HCC) (07/14/2022), PVC (premature ventricular contraction), and Snores. Past Surgical History  has a past surgical history that includes Nose surgery; Replacement total knee (Left); and Shoulder arthroscopy with subacromial decompression, rotator cuff repair and bicep tendon repair (Left, 08/02/2018). Social History  reports that he has never smoked. He has never used smokeless tobacco. He reports current drug use. Drug: Marijuana. He reports that he does not drink alcohol. Family History family history includes Heart attack in his father; Heart disease in his father.  Depression Screen  and Health Maintenance:    07/14/2022    1:52 PM  PHQ 2/9 Scores  PHQ - 2 Score 0   Health Maintenance  Topic Date Due   Colonoscopy  Never done   INFLUENZA VACCINE  09/11/2022   HPV VACCINES  Aged Out   DTaP/Tdap/Td  Discontinued   COVID-19 Vaccine  Discontinued   Hepatitis C Screening  Discontinued   HIV Screening  Discontinued   Zoster Vaccines- Shingrix  Discontinued   Immunization History  Administered Date(s) Administered   Pneumococcal Polysaccharide-23 02/10/2005        Objective:  Physical Exam  BP (!) 166/95 (BP Location: Left Arm, Patient Position: Sitting)   Pulse 65   Temp 98.3 F (36.8 C) (Temporal)   Ht 6' (1.829 m)   Wt 232 lb 6.4 oz (105.4 kg)   SpO2 96%   BMI 31.52 kg/m  Wt Readings from Last 10 Encounters:  07/14/22 232 lb 6.4 oz (105.4 kg)  04/01/19 286 lb 9.6 oz (130 kg)  08/09/18 257 lb (116.6 kg)  08/02/18 265 lb (120.2 kg)  07/15/18 265 lb (120.2 kg)  07/02/18 270 lb 3.2 oz (122.6 kg)  08/17/14 225 lb (102.1 kg)   Vital signs reviewed.  Nursing notes reviewed. Weight trend reviewed. Abnormalities and  problem-specific physical exam findings:  early male pattern balding, slight truncal adiposity although BMI suggests obesity. General Appearance:  Well developed, well nourished, well-groomed, healthy-appearing male with Body mass index is 31.52 kg/m. No acute distress appreciable.   Skin: Clear and well-hydrated. Pulmonary:  Normal work of breathing at rest, no respiratory distress apparent. SpO2: 96 %  Musculoskeletal: He demonstrates smooth and coordinated movements throughout all major joints.All extremities are intact.  Neurological:  Awake, alert, oriented, and engaged.  No obvious focal neurological deficits or cognitive impairments.  Sensorium seems unclouded.  Psychiatric:  Appropriate mood, pleasant and cooperative demeanor, cheerful and engaged during the exam  Reviewed Results    Results for orders placed or performed during the  hospital encounter of 08/02/18  Basic metabolic panel  Result Value Ref Range   Sodium 135 135 - 145 mmol/L   Potassium 4.4 3.5 - 5.1 mmol/L   Chloride 103 98 - 111 mmol/L   CO2 25 22 - 32 mmol/L   Glucose, Bld 137 (H) 70 - 99 mg/dL   BUN 13 6 - 20 mg/dL   Creatinine, Ser 0.98 0.61 - 1.24 mg/dL   Calcium 9.0 8.9 - 11.9 mg/dL   GFR calc non Af Amer >60 >60 mL/min   GFR calc Af Amer >60 >60 mL/min   Anion gap 7 5 - 15  CBC  Result Value Ref Range   WBC 8.0 4.0 - 10.5 K/uL   RBC 5.09 4.22 - 5.81 MIL/uL   Hemoglobin 14.8 13.0 - 17.0 g/dL   HCT 14.7 82.9 - 56.2 %   MCV 90.4 80.0 - 100.0 fL   MCH 29.1 26.0 - 34.0 pg   MCHC 32.2 30.0 - 36.0 g/dL   RDW 13.0 86.5 - 78.4 %   Platelets 202 150 - 400 K/uL   nRBC 0.0 0.0 - 0.2 %    No results found for any visits on 07/14/22.  No results found for this or any previous visit (from the past 2160 hour(s)).  No image results found.   No results found.      Additional notes: Initial Appointment Goals:  This initial visit focused on establishing a foundation for the patient's care. We collaboratively reviewed his medical history and medications in detail, updating the chart as shown in the encounter. Given the extensive information, we prioritized addressing his most pressing concerns, which he reported were New Patient (Initial Visit) and Hypertension   While the complexity of the patient's medical picture may necessitate further evaluation in subsequent visits, we were able to develop a preliminary care plan together. To expedite a comprehensive plan at the next visit, we encouraged the patient to gather relevant medical records from previous providers. This collaborative approach will ensure a more complete understanding of the patient's health and inform the development of a personalized care plan. We look forward to continuing the conversation and working together with the patient on achieving his health goals.   Collaborative Documentation:   Today's encounter utilized real-time, dynamic patient engagement.  Patients actively participate by directly reviewing and assisting in updating their medical records through a shared screen. This transparency empowers patients to visually confirm chart updates made by the healthcare provider.  This collaborative approach facilitates problem management as we jointly update the problem list, problem overview, and assessment/plan. Ultimately, this process enhances chart accuracy and completeness, fostering shared decision-making, patient education, and informed consent for tests and treatments.  Collaborative Treatment Planning:  Treatment plans were discussed and reviewed in detail.  Explained medication safety and potential side effects.  Encouraged participation and answered all patient questions, confirming understanding and comfort with the plan. Encouraged patient to contact our office if they have any questions or concerns. Agreed on patient returning to office if symptoms worsen, persist, or new symptoms develop. Discussed precautions in case of needing to visit the Emergency Department.  ----------------------------------------------------- [name], MD  07/14/2022 7:10 PM  [facility]:  [phone]

## 2022-07-14 NOTE — Assessment & Plan Note (Signed)
Encouraged patient to to self monitor Reviewed available data from patient and  BP Readings from Last 3 Encounters:  07/14/22 (!) 166/95  04/01/19 (!) 165/84  08/09/18 140/79   My individualized, goal average blood pressure for this patient, after considering the evidence for and against aggressive blood pressure goals as well as their past medical history and preferences, is 140/90 In my medical opinion, this problem is stable, inadequately controlled  Increased medication(s) prescribed after collaborative review. Following informed consent, we adjusted the medication regimen as per documented orders to now be:  Current hypertension medications:     No hypertension medications on med list     We explained the expected benefits and potential side effects of the new medications and encouraged the patient to report any concerns. Information for patient review: Please limit and avoid: salt, alcohol, NSAIDS, excess body weight, smoking, stress, sedentary lifestyles The risks of poor control over time are FUTURE stroke and heart attacks, but if you have a blood pressure over 180 and any red flag symptoms(headache/shortness of breath/confusion/chest discomfort) you should go to the ER.  You are encouraged to do home blood pressure monitoring, at least as many times per week as blood pressure medications you are on.  For example, bring a diary with 3 home blood pressure readings per week to each visit if you are on 3 blood pressure medications.   If you are on more than 3 medication(s) or have certain risk factors, we should do a resistant hypertension workup See AFTER VISIT SUMMARY for addition educational information provided Please let me know in advance when you need medication(s) refills, consistently taking your medication is very important!

## 2022-07-15 ENCOUNTER — Other Ambulatory Visit: Payer: Self-pay

## 2022-08-11 ENCOUNTER — Other Ambulatory Visit (INDEPENDENT_AMBULATORY_CARE_PROVIDER_SITE_OTHER): Payer: No Typology Code available for payment source

## 2022-08-11 DIAGNOSIS — E663 Overweight: Secondary | ICD-10-CM | POA: Diagnosis not present

## 2022-08-11 LAB — LIPID PANEL
Cholesterol: 200 mg/dL (ref 0–200)
HDL: 60.8 mg/dL (ref >39.00)
LDL Cholesterol: 121 mg/dL — ABNORMAL HIGH (ref 0–99)
NonHDL: 139.37
Total CHOL/HDL Ratio: 3
Triglycerides: 90 mg/dL (ref 0.0–149.0)
VLDL: 18 mg/dL (ref 0.0–40.0)

## 2022-08-11 LAB — CBC WITH DIFFERENTIAL/PLATELET
Basophils Absolute: 0 10*3/uL (ref 0.0–0.1)
Basophils Relative: 0.9 % (ref 0.0–3.0)
Eosinophils Absolute: 0.3 10*3/uL (ref 0.0–0.7)
Eosinophils Relative: 7.6 % — ABNORMAL HIGH (ref 0.0–5.0)
HCT: 45.1 % (ref 39.0–52.0)
Hemoglobin: 14.6 g/dL (ref 13.0–17.0)
Lymphocytes Relative: 28.3 % (ref 12.0–46.0)
Lymphs Abs: 1.2 10*3/uL (ref 0.7–4.0)
MCHC: 32.3 g/dL (ref 30.0–36.0)
MCV: 92.3 fl (ref 78.0–100.0)
Monocytes Absolute: 0.3 10*3/uL (ref 0.1–1.0)
Monocytes Relative: 7 % (ref 3.0–12.0)
Neutro Abs: 2.5 10*3/uL (ref 1.4–7.7)
Neutrophils Relative %: 56.2 % (ref 43.0–77.0)
Platelets: 225 10*3/uL (ref 150.0–400.0)
RBC: 4.89 Mil/uL (ref 4.22–5.81)
RDW: 13.7 % (ref 11.5–15.5)
WBC: 4.4 10*3/uL (ref 4.0–10.5)

## 2022-08-11 LAB — COMPREHENSIVE METABOLIC PANEL
ALT: 15 U/L (ref 0–53)
AST: 19 U/L (ref 0–37)
Albumin: 4.3 g/dL (ref 3.5–5.2)
Alkaline Phosphatase: 55 U/L (ref 39–117)
BUN: 9 mg/dL (ref 6–23)
CO2: 28 mEq/L (ref 19–32)
Calcium: 9 mg/dL (ref 8.4–10.5)
Chloride: 102 mEq/L (ref 96–112)
Creatinine, Ser: 0.72 mg/dL (ref 0.40–1.50)
GFR: 100.48 mL/min (ref >60.00)
Glucose, Bld: 102 mg/dL — ABNORMAL HIGH (ref 70–99)
Potassium: 4 mEq/L (ref 3.5–5.1)
Sodium: 138 mEq/L (ref 135–145)
Total Bilirubin: 1.2 mg/dL (ref 0.2–1.2)
Total Protein: 7 g/dL (ref 6.0–8.3)

## 2022-08-11 LAB — TSH: TSH: 2.97 u[IU]/mL (ref 0.35–5.50)

## 2022-08-11 LAB — HEMOGLOBIN A1C: Hgb A1c MFr Bld: 5.9 % (ref 4.6–6.5)

## 2022-08-12 ENCOUNTER — Encounter: Payer: Self-pay | Admitting: Internal Medicine

## 2022-08-12 DIAGNOSIS — E785 Hyperlipidemia, unspecified: Secondary | ICD-10-CM | POA: Insufficient documentation

## 2022-08-12 NOTE — Progress Notes (Signed)
I've reviewed the results and they are all essentially normal, except for cholesterol levels.  HDL Cholesterol was 60.80 3: this is good cholesterol we want to raise the levels as high as possible.  The longest lived individuals typically have levels over 100. LDL cholesterol levels were LDLcalc 121 this is bad cholesterol that forms plaques in the arteries that cause strokes and heart attacks, we want the levels as low as possible.  People with history of cardiovascular disease take medicine to get to a goal of 50, but lower might be even better. Triglycerides Levels were 90.0 these contribute to cardiovascular disease and gallstones, we want the levels as low as possible.  Low carb diets and weight loss are very effective at keeping this low.  While we'll discuss the details at your next appointment, we wanted to share some initial thoughts and next steps.  Diet: Focus on avoiding foods that have saturated fats, high cholesterol, or high levels of carbohydrates like sugar, starch, and grains.  Even more important, be sure to eat lots of healthy omega-3 unsaturated fats like Extra Virgin Olive Oil, fatty fish, nuts, and avocados.  Its safe to take omega-3 supplements. Exercise: Regular physical activity is crucial for overall health and can benefit your cholesterol. Weight Management: If you're overweight or obese, reaching a healthy weight can positively impact your cholesterol. Potential Medication: Depending on your individual risk factors, we might explore the benefits of medications to further manage your cholesterol and heart health.  We'll discuss this in detail at your next visit.  Collaboration is Key: We value your active participation in your care plan. At your next appointment, we can discuss your questions and preferences to create a personalized approach that works best for you.  Next Steps: We encourage you to make any suggested lifestyle changes as described above We encourage you  to get cholesterol rechecked in 6-12 months If you're interested, we could have an extra appointment to: (1) Review your cholesterol results in more detail. (2) Discuss personalized lifestyle changes and potential medication options. (3) Answer your questions and address any concerns you may have. (4) Discuss additional cholesterol testing options and cardiac risk evaluation tests that are available  In the meantime, you can find some resources on heart-healthy living at [email or web address] Please let us know if you have any questions before your next appointment.

## 2022-08-13 ENCOUNTER — Encounter: Payer: Self-pay | Admitting: Internal Medicine

## 2022-08-13 ENCOUNTER — Ambulatory Visit (INDEPENDENT_AMBULATORY_CARE_PROVIDER_SITE_OTHER): Payer: No Typology Code available for payment source | Admitting: Internal Medicine

## 2022-08-13 VITALS — BP 114/72 | HR 75 | Temp 98.2°F | Ht 72.0 in | Wt 227.6 lb

## 2022-08-13 DIAGNOSIS — L989 Disorder of the skin and subcutaneous tissue, unspecified: Secondary | ICD-10-CM

## 2022-08-13 DIAGNOSIS — Z0000 Encounter for general adult medical examination without abnormal findings: Secondary | ICD-10-CM

## 2022-08-13 DIAGNOSIS — Z0001 Encounter for general adult medical examination with abnormal findings: Secondary | ICD-10-CM

## 2022-08-13 DIAGNOSIS — E7800 Pure hypercholesterolemia, unspecified: Secondary | ICD-10-CM

## 2022-08-13 DIAGNOSIS — R7303 Prediabetes: Secondary | ICD-10-CM | POA: Insufficient documentation

## 2022-08-13 NOTE — Addendum Note (Signed)
Addended by: Donzetta Starch on: 08/13/2022 01:34 PM   Modules accepted: Orders
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
## 2022-08-13 NOTE — Patient Instructions (Addendum)
VISIT SUMMARY:  During your annual wellness visit, we discussed your recent improvement in blood pressure due to lifestyle changes, your weight loss goals, your high cholesterol levels, your right shoulder pain, a skin lesion on your arm, and your history of heart rhythm irregularities. We also discussed the importance of regular dental and vision exams, and your history of ear wax buildup.  YOUR PLAN:  -HIGH BLOOD PRESSURE: Your high blood pressure has improved due to changes in your lifestyle such as better hydration, improved sleep, and reduced stress. Continue with these changes to maintain your blood pressure without the need for medication.  -WEIGHT MANAGEMENT: You've lost some weight and aim to lose an additional 20 pounds. Keep up with your current weight loss strategies to reach your goal.  -HIGH CHOLESTEROL: Your cholesterol levels are slightly high, which can increase your risk of heart attack or stroke. Continue eating a heart-healthy diet and limit red meats and processed foods to help manage your cholesterol levels.  -RISK OF DIABETES: Your blood sugar levels are slightly high, which can increase your risk of developing diabetes. Reduce your intake of carbohydrates, especially processed and sugary foods, to help manage your blood sugar levels.  -SKIN LESIONS: You have a skin lesion on your arm and a skin tag on your leg. You've been referred to a dermatologist for further evaluation and possible removal.  -HEART RHYTHM: You have a history of heart rhythm issues. Monitor your heart rhythm at home using a personal device and share the readings with me.  INSTRUCTIONS:  You should complete the Cologuard test for colon cancer screening when it arrives. Schedule dental and vision exams, consider getting the shingles vaccination, continue sinus rinses for chronic allergies, and return for an annual check-up in one year or sooner if any new symptoms arise.  Managing Your Health Over  Time  Managing every aspect of your health in a single visit isn't always feasible, but that's okay.  We addressed many preventive issues today and charted a course for future care. Acute conditions or preventive care measures may require further attention.  We encourage you to schedule a follow-up visit at your earliest convenience to discuss any unresolved issues.  We strongly encourage continued participation in annual preventive care visits to help [place] develop a more thorough understanding of your health and to help you maintain optimal wellness - please inquire about scheduling your next one with [place] at your earliest convenience.  Next Steps  [x]   Schedule Follow-Up:  We recommend a follow-up appointment in 1 year for your next wellness visit.  If you develop any new problems, want to address any medical issues, or your condition worsens before then, please call us for an appointment or seek emergency care. [x]   Preventive Care:  Make sure to keep regular appointments with dental and vision professionals, use nightly nasal saline mist sprays to keep your sinuses clear and toothbrushing to protect your teeth. Use [employer] App or other app to track your sleep quality. Check blood pressure and heart rate routinely. [x]   Medical Information Release:  For any relevant medical information we don't have, please sign a release form at the front desk so we can obtain it for your records. [x]   Lab Tests:  Schedule any lab tests from today for within a week to ensure best insurance coverage.    Making the Most of Our Focused (20 minute) Appointments:  [x]   Clearly state your top concerns at the beginning of the visit to focus  our discussion [x]   If you anticipate you will need more time, please inform the front desk during scheduling - we can book multiple appointments in the same week. [x]   If you have transportation problems- use our convenient video appointments or ask about transportation support. [x]    We can get down to business faster if you use MyChart to update information before the visit and submit non-urgent questions before your visit. Thank you for taking the time to provide details through MyChart.  Let our nurse know and she can import this information into your encounter documents.  Arrival and Wait Times: [x]   Arriving on time ensures that everyone receives prompt attention. [x]   Early morning (8a) and afternoon (1p) appointments tend to have shortest wait times. [x]   Unfortunately, we cannot delay appointments for late arrivals or hold slots during phone calls.  Bring to Your Next Appointment  [x]   Medications: Please bring all your medication bottles to your next appointment to ensure we have an accurate record of your prescriptions. [x]   Health Diaries: If you're monitoring any health conditions at home, keeping a diary of your readings can be very helpful for discussions at your next appointment.  Reviewing Your Records  [x]   Review your attached preventive care information at the end of these patient instructions. [x]   Review this early draft of your clinical encounter notes below and the final encounter summary tomorrow on MyChart after its been completed.   Pure hypercholesterolemia  Prediabetes  Healthcare maintenance  Encounter for annual general medical examination with abnormal findings in adult  Preventative health care  Skin lesion -     Ambulatory referral to Dermatology     Getting Answers and Following Up  [x]   Simple Questions & Concerns: For quick questions or basic follow-up after your visit, reach [place] at [phone] or MyChart messaging. [x]   Complex Concerns: If your concern is more complex, scheduling an appointment might be best. Discuss this with the staff to find the most suitable option. [x]   Lab & Imaging Results: We'll contact you directly if results are abnormal or you don't use MyChart. Most normal results will be on MyChart within 2-3  business days, with a review message from Dr. Jon Billings. Haven't heard back in 2 weeks? Need results sooner? Contact us at [phone]. [x]   Referrals: Our referral coordinator will manage specialist referrals. The specialist's office should contact you within 2 weeks to schedule an appointment. Call us if you haven't heard from them after 2 weeks.  Staying Connected  [x]   MyChart: Activate your MyChart for the fastest way to access results and message [place]. See the last page of this paperwork for instructions on how to activate.  Billing  [x]   X-ray & Lab Orders: These are billed by separate companies. Contact the invoicing company directly for questions or concerns. [x]   Visit Charges: Discuss any billing inquiries with our administrative services team.  Your Satisfaction Matters  [x]   Share Your Experience: We strive for your satisfaction! If you have any complaints, or preferably compliments, please let Dr. Jon Billings know directly or contact our Practice Administrators, Edwena Felty or [employer], by asking at the front desk.

## 2023-08-17 ENCOUNTER — Encounter: Payer: No Typology Code available for payment source | Admitting: Internal Medicine

## 2024-01-20 ENCOUNTER — Other Ambulatory Visit (HOSPITAL_BASED_OUTPATIENT_CLINIC_OR_DEPARTMENT_OTHER): Payer: Self-pay

## 2024-01-20 MED ORDER — AMOXICILLIN 500 MG PO CAPS
500.0000 mg | ORAL_CAPSULE | Freq: Three times a day (TID) | ORAL | 0 refills | Status: AC
  Filled 2024-01-20: qty 30, 10d supply, fill #0
